# Patient Record
Sex: Female | Born: 1990 | Race: Black or African American | Hispanic: No | Marital: Single | Smoking: Current every day smoker
Health system: Southern US, Community
[De-identification: ages and names within clinical notes are randomized; demographics above are authoritative.]

## PROBLEM LIST (undated history)

## (undated) DIAGNOSIS — A599 Trichomoniasis, unspecified: Secondary | ICD-10-CM

## (undated) DIAGNOSIS — R87619 Unspecified abnormal cytological findings in specimens from cervix uteri: Secondary | ICD-10-CM

## (undated) DIAGNOSIS — J40 Bronchitis, not specified as acute or chronic: Secondary | ICD-10-CM

## (undated) DIAGNOSIS — A749 Chlamydial infection, unspecified: Secondary | ICD-10-CM

## (undated) DIAGNOSIS — B9689 Other specified bacterial agents as the cause of diseases classified elsewhere: Secondary | ICD-10-CM

## (undated) DIAGNOSIS — N760 Acute vaginitis: Secondary | ICD-10-CM

## (undated) DIAGNOSIS — A539 Syphilis, unspecified: Secondary | ICD-10-CM

## (undated) HISTORY — DX: Unspecified abnormal cytological findings in specimens from cervix uteri: R87.619

---
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
## 2012-10-29 IMAGING — CR DG CHEST 2V
2 series · 2 of 2 positions shown · non-contrast
Comparison: Chest x-ray 05/03/2010

CLINICAL DATA: Pain in the upper back.

CHEST - 2 VIEW

[w chest pa]
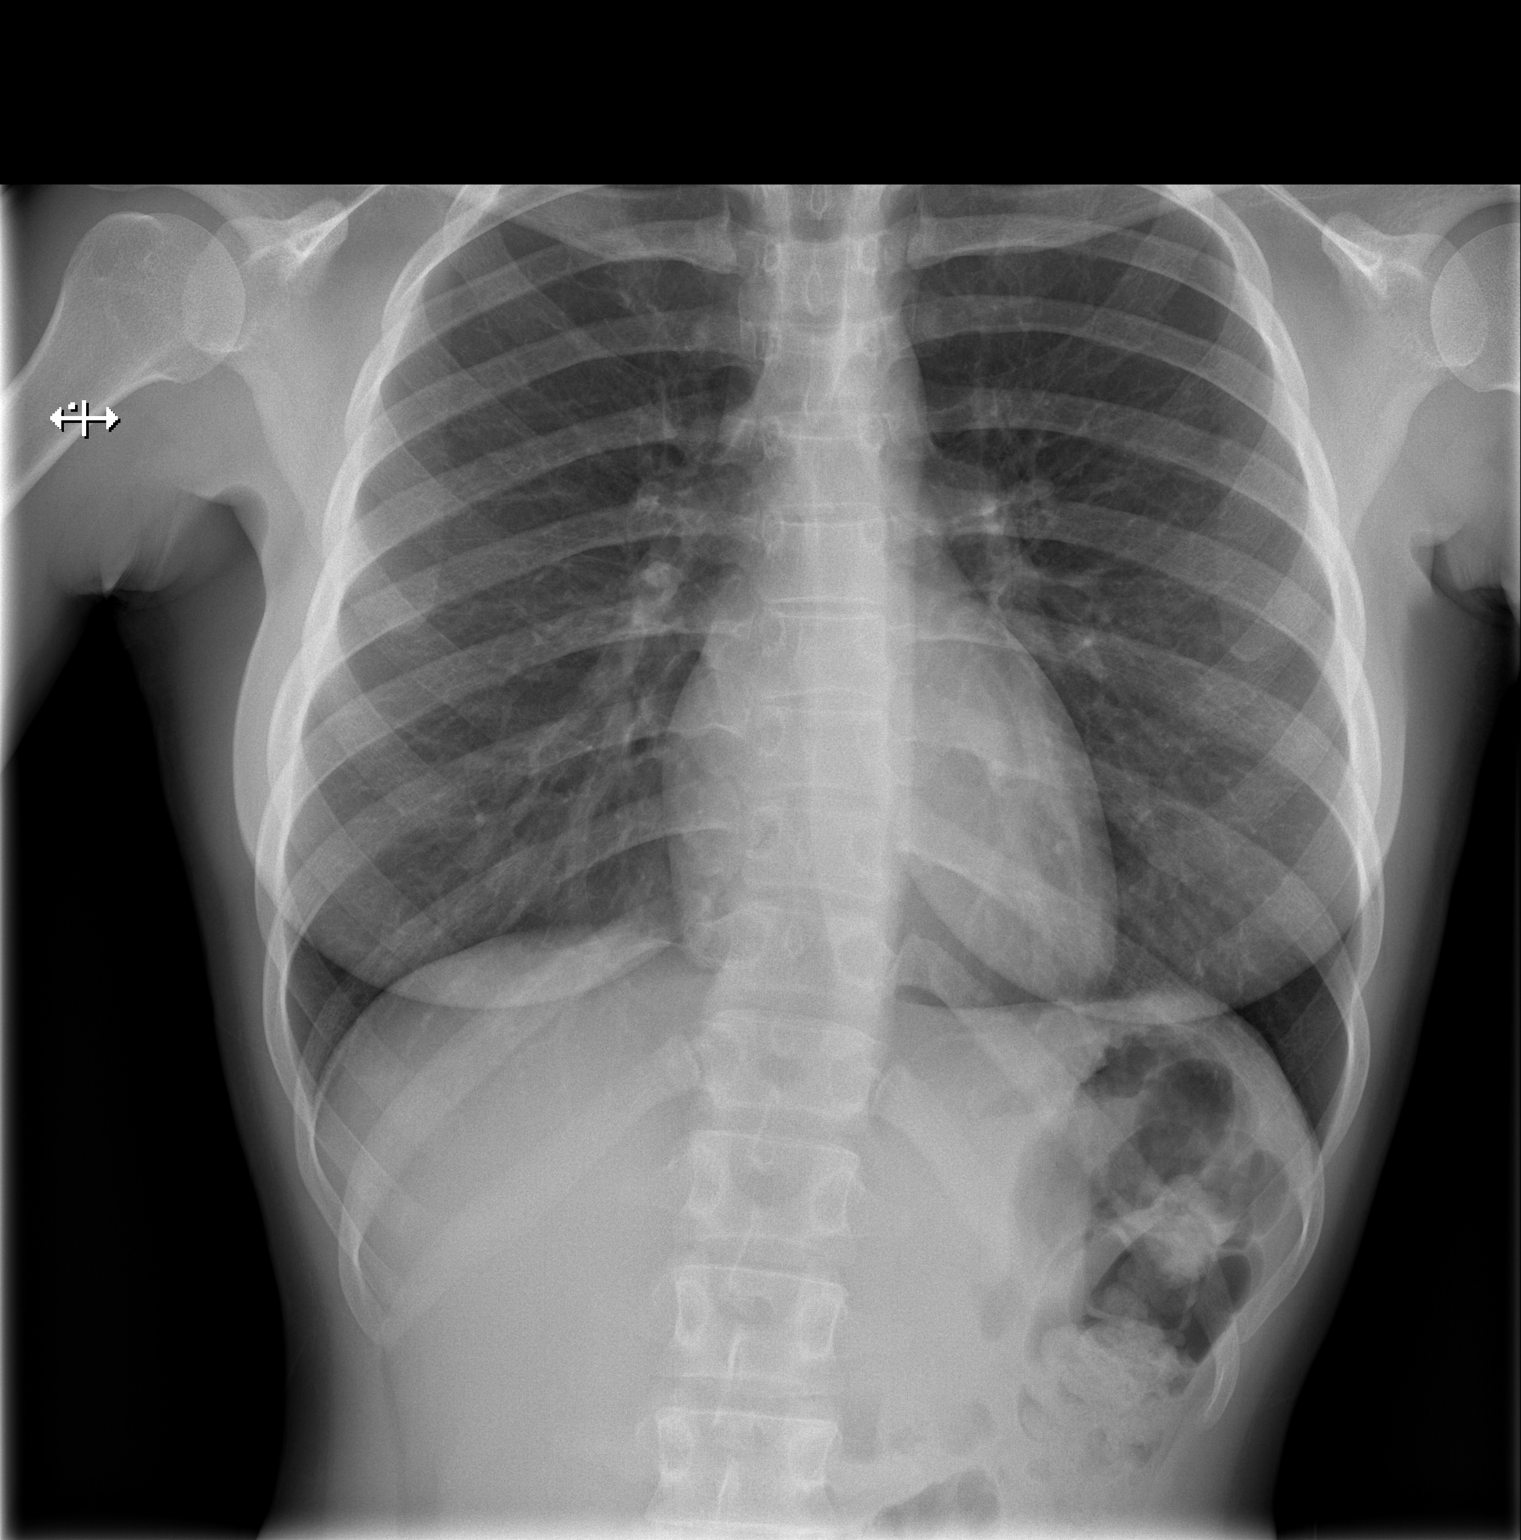

[w chest lat]
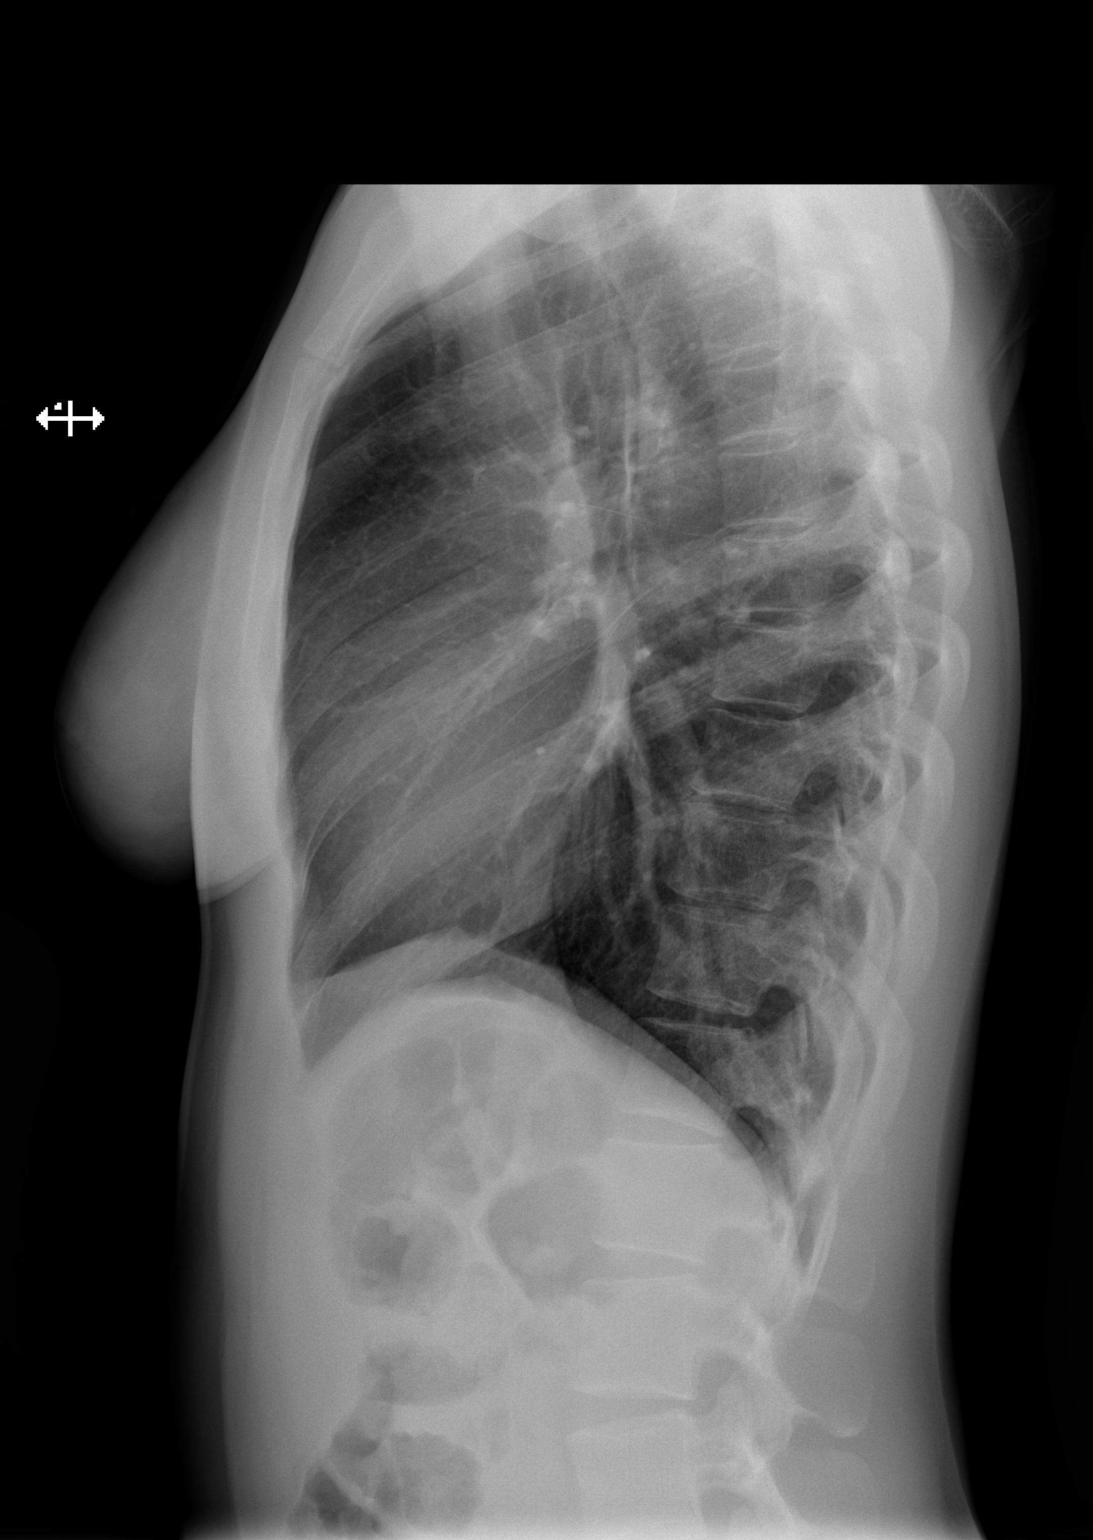

[2 of 2 positions shown; findings below may reference images not displayed]

FINDINGS: Lung volumes are normal.  No consolidative airspace
disease.  No pleural effusions.  No pneumothorax.  No pulmonary
nodule or mass noted.  Pulmonary vasculature and the
cardiomediastinal silhouette are within normal limits.
IMPRESSION: 1. No radiographic evidence of acute cardiopulmonary disease.

## 2012-11-17 ENCOUNTER — Emergency Department (HOSPITAL_COMMUNITY)
Admission: EM | Admit: 2012-11-17 | Discharge: 2012-11-17 | Disposition: A | Discharge status: Home / Self Care | Payer: Self-pay | Attending: Emergency Medicine | Admitting: Emergency Medicine

## 2012-11-17 ENCOUNTER — Encounter (HOSPITAL_COMMUNITY): Payer: Self-pay | Admitting: Emergency Medicine

## 2012-11-17 DIAGNOSIS — T50905A Adverse effect of unspecified drugs, medicaments and biological substances, initial encounter: Secondary | ICD-10-CM

## 2012-11-17 DIAGNOSIS — N898 Other specified noninflammatory disorders of vagina: Secondary | ICD-10-CM | POA: Insufficient documentation

## 2012-11-17 DIAGNOSIS — Z79899 Other long term (current) drug therapy: Secondary | ICD-10-CM | POA: Insufficient documentation

## 2012-11-17 DIAGNOSIS — Z8709 Personal history of other diseases of the respiratory system: Secondary | ICD-10-CM | POA: Insufficient documentation

## 2012-11-17 DIAGNOSIS — Z8619 Personal history of other infectious and parasitic diseases: Secondary | ICD-10-CM | POA: Insufficient documentation

## 2012-11-17 DIAGNOSIS — Y9389 Activity, other specified: Secondary | ICD-10-CM | POA: Insufficient documentation

## 2012-11-17 DIAGNOSIS — F17200 Nicotine dependence, unspecified, uncomplicated: Secondary | ICD-10-CM | POA: Insufficient documentation

## 2012-11-17 DIAGNOSIS — T38801A Poisoning by unspecified hormones and synthetic substitutes, accidental (unintentional), initial encounter: Secondary | ICD-10-CM | POA: Insufficient documentation

## 2012-11-17 DIAGNOSIS — T385X1A Poisoning by other estrogens and progestogens, accidental (unintentional), initial encounter: Secondary | ICD-10-CM | POA: Insufficient documentation

## 2012-11-17 DIAGNOSIS — Y929 Unspecified place or not applicable: Secondary | ICD-10-CM | POA: Insufficient documentation

## 2012-11-17 NOTE — ED Notes (Signed)
Pt added that she is also having sticky discharge this am.  Denies itching or abd pain.  States she recently tested negative for std.

## 2012-11-17 NOTE — ED Provider Notes (Signed)
History  This chart was scribed for non-physician practitioner working with Raeford Razor, MD by Greggory Stallion, ED scribe. This patient was seen in room WTR9/WTR9 and the patient's care was started at 3:34 PM.  CSN: [id number] Arrival date & time 11/17/12  1518   Chief Complaint  Patient presents with  . Rash   The history is provided by the patient. No language interpreter was used.    HPI Comments: Melanie Carr is a 22 y.o. female who presents to the Emergency Department with chief complaint of gradual onset, constant skin discoloration on her abdomen where she gets her depo shots that started one year ago. Pt states she got the new depo shot in summer 2013 and December 2013. Pt states there is a lump under the discoloration. She states she gets her dep[facility] shots from [facility]. Pt states she had sticky, clear vaginal discharge this morning. Pt states she got tested for STDs 2 weeks ago and it was negative for everything. She declines pelvic exam today.  Past Medical History  Diagnosis Date  . Bronchitis   . Trichomonosis   . Chlamydia   . Syphilis   . Bacterial vaginal infection    History reviewed. No pertinent past surgical history. History reviewed. No pertinent family history. History  Substance Use Topics  . Smoking status: Current Every Day Smoker  . Smokeless tobacco: Not on file  . Alcohol Use: No   OB History   Grav Para Term Preterm Abortions TAB SAB Ect Mult Living                 Review of Systems  A complete 10 system review of systems was obtained and all systems are negative except as noted in the HPI and PMH.   Allergies  Review of patient's allergies indicates no known allergies.  Home Medications   Current Outpatient Rx  Name  Route  Sig  Dispense  Refill  . medroxyPROGESTERone (DEPO-PROVERA) 150 MG/ML injection   Intramuscular   Inject 150 mg into the muscle every 3 (three) months. Due for another shot in march          BP 104/60  Pulse 88  Temp(Src) 99.1 F (37.3 C) (Oral)  Resp 16  SpO2 99%  Physical Exam  Nursing note and vitals reviewed. Constitutional: She is oriented to person, place, and time. She appears well-developed and well-nourished. No distress.  HENT:  Head: Normocephalic and atraumatic.  Eyes: EOM are normal.  Neck: Neck supple. No tracheal deviation present.  Cardiovascular: Normal rate.   Pulmonary/Chest: Effort normal. No respiratory distress.  Abdominal: Soft. She exhibits no distension and no mass. There is no tenderness. There is no rebound and no guarding.  Musculoskeletal: Normal range of motion.  Neurological: She is alert and oriented to person, place, and time.  Skin: Skin is warm and dry.     Two 1 cm areas of hypopigmentation on the abdomen near the naval as diagramed. No signs of infection, abscess, or cellulitis.   Psychiatric: She has a normal mood and affect. Her behavior is normal.    ED Course  Procedures (including critical care time)  DIAGNOSTIC STUDIES: Oxygen Saturation is 99% on RA, normal by my interpretation.    COORDINATION OF CARE: 4:10 PM-Discussed treatment plan with pt at bedside and pt agreed to plan.   Labs Reviewed - No data to display No results found. 1. Drug reaction, initial encounter     MDM  Patient with hypopigmentation following Depo-Provera injection. No evidence of surrounding cellulitis or infection. No emergent process. Will discharge to home with OB/GYN followup. Additionally, patient states that she has had some clear vaginal discharge today. However, she declines pelvic exam, and states that she will followup with OB/GYN. I told her that I would not do to her the source of the discharge without pelvic exam. She nausea is that she understands, but chooses to defer the pelvic today.        I personally performed the services described in this documentation, which was scribed in my presence. The recorded  information has been reviewed and is accurate.    Roxy Horseman, PA-C 11/17/12 [phone]

## 2012-11-17 NOTE — ED Notes (Addendum)
Pt states she was injected with depo-provera shot in abd in december and has since had skin lightening at site of injection. Pt states this has happened before. Pt went back 2 weeks ago and was recommended she get it checked.

## 2012-11-18 NOTE — ED Provider Notes (Signed)
Medical screening examination/treatment/procedure(s) were performed by non-physician practitioner and as supervising physician I was immediately available for consultation/collaboration.  Kaydyn Sayas, MD 11/18/12 0129 

## 2012-12-15 ENCOUNTER — Encounter: Payer: Medicaid Other | Admitting: Obstetrics and Gynecology

## 2013-08-03 ENCOUNTER — Telehealth: Payer: Self-pay | Admitting: Genetic Counselor

## 2013-08-03 ENCOUNTER — Other Ambulatory Visit (HOSPITAL_COMMUNITY)
Admission: RE | Admit: 2013-08-03 | Discharge: 2013-08-03 | Disposition: A | Discharge status: Home / Self Care | Payer: BC Managed Care – PPO | Source: Ambulatory Visit | Attending: Obstetrics and Gynecology | Admitting: Obstetrics and Gynecology

## 2013-08-03 ENCOUNTER — Other Ambulatory Visit: Payer: Self-pay | Admitting: Obstetrics and Gynecology

## 2013-08-03 DIAGNOSIS — Z01419 Encounter for gynecological examination (general) (routine) without abnormal findings: Secondary | ICD-10-CM | POA: Insufficient documentation

## 2013-08-03 NOTE — Telephone Encounter (Signed)
LEFT MESSAGE FOR PATIENT TO RETURN CALL TO SCHEDULE GENETIC APPT.  °

## 2014-02-10 ENCOUNTER — Emergency Department (HOSPITAL_COMMUNITY)
Admission: EM | Admit: 2014-02-10 | Discharge: 2014-02-10 | Disposition: A | Discharge status: Home / Self Care | Payer: BC Managed Care – PPO | Source: Home / Self Care | Attending: Family Medicine | Admitting: Family Medicine

## 2014-02-10 ENCOUNTER — Encounter (HOSPITAL_COMMUNITY): Payer: Self-pay | Admitting: Family Medicine

## 2014-02-10 DIAGNOSIS — B9789 Other viral agents as the cause of diseases classified elsewhere: Principal | ICD-10-CM

## 2014-02-10 DIAGNOSIS — J069 Acute upper respiratory infection, unspecified: Secondary | ICD-10-CM

## 2014-02-10 LAB — POCT RAPID STREP A: STREPTOCOCCUS, GROUP A SCREEN (DIRECT): NEGATIVE

## 2014-02-10 MED ORDER — IPRATROPIUM BROMIDE 0.06 % NA SOLN: 2.0000 | Freq: Four times a day (QID) | NASAL | Status: DC

## 2014-02-10 MED ORDER — IBUPROFEN 600 MG PO TABS: 600.0000 mg | ORAL_TABLET | Freq: Four times a day (QID) | ORAL | Status: DC | PRN

## 2014-02-10 MED ORDER — FLUTICASONE PROPIONATE 50 MCG/ACT NA SUSP: 2.0000 | Freq: Every day | NASAL | Status: DC

## 2014-02-10 NOTE — Discharge Instructions (Signed)
Your symptoms are likely due to a viral cold This should go away in another 1-5 days Please start the flonase at night adn atrovent during the day. Please also start the motrin 600 every 6 hours Please also consider using Mucinex or mucinex D during the day and Nyquill at night Please call if you are not better after a total of 7-10 days of illness or are getting worse.

## 2014-02-10 NOTE — ED Notes (Signed)
C/o cold sx onset yest am Sx inc congestion, runny nose, ST, facial pressure, vomiting this am Denies f/d, SOB, wheezing Alert, no signs of acute distress.

## 2014-02-10 NOTE — ED Provider Notes (Signed)
CSN: [id number]     Arrival date & time 02/10/14  0857 History   First MD Initiated Contact with Patient 02/10/14 [phone]     Chief Complaint  Patient presents with  . URI   (Consider location/radiation/quality/duration/timing/severity/associated sxs/prior Treatment) HPI  Sore throat: started 24 hrs ago. Associated w/ rinorrhea, sinus congestion. Post tussive emessis. nyquil w/ benefit. Sick contacts. Actively coughing in room.  No change. Deneis fevers or diarrhea.   Tobacco: cutting back and using gum to help   Past Medical History  Diagnosis Date  . Bronchitis   . Trichomonosis   . Chlamydia   . Syphilis   . Bacterial vaginal infection    History reviewed. No pertinent past surgical history. No family history on file. History  Substance Use Topics  . Smoking status: Current Every Day Smoker -- 0.25 packs/day  . Smokeless tobacco: Not on file  . Alcohol Use: No   OB History   Grav Para Term Preterm Abortions TAB SAB Ect Mult Living                 Review of Systems Per HPI with all other pertinent systems negative.   Allergies  Review of patient's allergies indicates no known allergies.  Home Medications   Prior to Admission medications   Medication Sig Start Date End Date Taking? Authorizing Provider  fluticasone (FLONASE) 50 MCG/ACT nasal spray Place 2 sprays into both nostrils daily. 02/10/14   Ozella Rocks, MD  ibuprofen (ADVIL,MOTRIN) 600 MG tablet Take 1 tablet (600 mg total) by mouth every 6 (six) hours as needed. 02/10/14   Ozella Rocks, MD  ipratropium (ATROVENT) 0.06 % nasal spray Place 2 sprays into both nostrils 4 (four) times daily. 02/10/14   Ozella Rocks, MD  medroxyPROGESTERone (DEPO-PROVERA) 150 MG/ML injection Inject 150 mg into the muscle every 3 (three) months. Due for another shot in march    Historical Provider, MD   BP 105/72  Pulse 87  Temp(Src) 98 F (36.7 C) (Oral)  Resp 16  SpO2 100%  LMP 02/03/2014 Physical Exam   Constitutional: She is oriented to person, place, and time. She appears well-developed and well-nourished. No distress.  HENT:  Head: Normocephalic and atraumatic.  Pharyngeal cobblestoning. Tonsils nml Maxillary sinuses mildly ttp L>R  Eyes: Pupils are equal, round, and reactive to light.  Neck: Normal range of motion.  Cardiovascular: Normal rate and normal heart sounds.   Pulmonary/Chest: Effort normal and breath sounds normal.  Abdominal: She exhibits no distension.  Musculoskeletal: Normal range of motion.  Neurological: She is oriented to person, place, and time.  Skin: Skin is warm and dry. She is not diaphoretic.  Psychiatric: Her behavior is normal. Judgment normal.    ED Course  Procedures (including critical care time) Labs Review Labs Reviewed  POCT RAPID STREP A ([facility] ONLY)    Imaging Review No results found.   MDM   1. Viral URI with cough    Flonase, nasal atrovent, motrin 600, mucinex, nyquil, fluids, rest Rapid Strp neg Stre cx sent ABX if not improving in 7-10 days. Precautions given and all questions answered  Shelly Flatten, MD Family Medicine 02/10/2014, 9:44 AM     Ozella Rocks, MD 02/10/14 [phone]

## 2014-02-12 LAB — CULTURE, GROUP A STREP

## 2014-10-20 ENCOUNTER — Encounter (HOSPITAL_COMMUNITY): Payer: Self-pay | Admitting: Emergency Medicine

## 2014-10-20 ENCOUNTER — Emergency Department (INDEPENDENT_AMBULATORY_CARE_PROVIDER_SITE_OTHER)
Admission: EM | Admit: 2014-10-20 | Discharge: 2014-10-20 | Disposition: A | Discharge status: Home / Self Care | Payer: Self-pay | Source: Home / Self Care | Attending: Emergency Medicine | Admitting: Emergency Medicine

## 2014-10-20 ENCOUNTER — Other Ambulatory Visit (HOSPITAL_COMMUNITY)
Admission: RE | Admit: 2014-10-20 | Discharge: 2014-10-20 | Disposition: A | Discharge status: Home / Self Care | Payer: Medicaid Other | Source: Ambulatory Visit | Attending: Emergency Medicine | Admitting: Emergency Medicine

## 2014-10-20 DIAGNOSIS — N939 Abnormal uterine and vaginal bleeding, unspecified: Secondary | ICD-10-CM

## 2014-10-20 DIAGNOSIS — Z113 Encounter for screening for infections with a predominantly sexual mode of transmission: Secondary | ICD-10-CM | POA: Insufficient documentation

## 2014-10-20 DIAGNOSIS — N760 Acute vaginitis: Secondary | ICD-10-CM | POA: Insufficient documentation

## 2014-10-20 LAB — POCT PREGNANCY, URINE: Preg Test, Ur: NEGATIVE

## 2014-10-20 MED ORDER — MEDROXYPROGESTERONE ACETATE 10 MG PO TABS: 10.0000 mg | ORAL_TABLET | Freq: Every day | ORAL | Status: DC

## 2014-10-20 NOTE — ED Notes (Signed)
C/o abn vaginal bleeding States her cycle came on the last week of April and lasted until second week of may and restarted may 21 No tx tried

## 2014-10-20 NOTE — Discharge Instructions (Signed)
This is likely a stress reaction. Take Provera daily for the next 2 weeks. You should expect bleeding when you stop the medication. If her cycles continue to be irregular, please follow[facility]up at [facility].

## 2014-10-20 NOTE — ED Provider Notes (Signed)
CSN: [id number]     Arrival date & time 10/20/14  1255 History   First MD Initiated Contact with Patient 10/20/14 1339     Chief Complaint  Patient presents with  . Vaginal Bleeding   (Consider location/radiation/quality/duration/timing/severity/associated sxs/prior Treatment) HPI  She is a 24 year old woman here for evaluation of vaginal bleeding. She states she had her normal period the end of April.  Then on May 21, she started having bleeding again. She has had constant bleeding since that time. It will sometimes be light, sometimes heavy. She denies any associated cramps. No vaginal discharge. No fevers. She has had a new sexual partner, but states it was only protected sex. She did have multiple life stressors back in February. She is not currently on any birth control. She stopped Ortho Tri-Cyclen 6 months ago.  Past Medical History  Diagnosis Date  . Bronchitis   . Trichomonosis   . Chlamydia   . Syphilis   . Bacterial vaginal infection    History reviewed. No pertinent past surgical history. History reviewed. No pertinent family history. History  Substance Use Topics  . Smoking status: Current Every Day Smoker -- 0.25 packs/day  . Smokeless tobacco: Not on file  . Alcohol Use: No   OB History    No data available     Review of Systems As in history of present illness Allergies  Review of patient's allergies indicates no known allergies.  Home Medications   Prior to Admission medications   Medication Sig Start Date End Date Taking? Authorizing Provider  medroxyPROGESTERone (PROVERA) 10 MG tablet Take 1 tablet (10 mg total) by mouth daily. For 2 weeks. 10/20/14   Charm RingsErin J Talissa Apple, MD   BP 114/76 mmHg  Pulse 66  Temp(Src) 98.2 F (36.8 C) (Oral)  Resp 12  SpO2 100%  LMP 10/09/2014 Physical Exam  Constitutional: She is oriented to person, place, and time. She appears well-developed and well-nourished. No distress.  Cardiovascular: Normal rate.   Pulmonary/Chest:  Effort normal.  Genitourinary: There is no rash on the right labia. There is no rash on the left labia. Cervix exhibits no motion tenderness and no discharge. There is bleeding in the vagina. No vaginal discharge found.  Neurological: She is alert and oriented to person, place, and time.    ED Course  Procedures (including critical care time) Labs Review Labs Reviewed  POCT PREGNANCY, URINE  CERVICOVAGINAL ANCILLARY ONLY    Imaging Review No results found.   MDM   1. Vaginal bleeding, abnormal    This is likely stress related. We'll treat with 14 days of Provera. Follow-up as needed.    Charm RingsErin J Sima Lindenberger, MD 10/20/14 [phone]

## 2014-10-21 LAB — CERVICOVAGINAL ANCILLARY ONLY
Chlamydia: NEGATIVE
Neisseria Gonorrhea: NEGATIVE

## 2014-10-22 LAB — CERVICOVAGINAL ANCILLARY ONLY: Wet Prep (BD Affirm): POSITIVE — AB

## 2014-10-26 NOTE — ED Notes (Signed)
No treatment per Dr Honig 

## 2016-12-14 ENCOUNTER — Encounter: Payer: Self-pay | Admitting: Physician Assistant

## 2016-12-14 ENCOUNTER — Ambulatory Visit (INDEPENDENT_AMBULATORY_CARE_PROVIDER_SITE_OTHER): Payer: 59 | Admitting: Physician Assistant

## 2016-12-14 VITALS — BP 105/66 | HR 86 | Temp 98.1°F | Resp 18 | Ht 65.0 in | Wt 128.6 lb

## 2016-12-14 DIAGNOSIS — Z113 Encounter for screening for infections with a predominantly sexual mode of transmission: Secondary | ICD-10-CM

## 2016-12-14 DIAGNOSIS — Z111 Encounter for screening for respiratory tuberculosis: Secondary | ICD-10-CM | POA: Diagnosis not present

## 2016-12-14 DIAGNOSIS — Z124 Encounter for screening for malignant neoplasm of cervix: Secondary | ICD-10-CM

## 2016-12-14 DIAGNOSIS — Z130 Encounter for screening for diseases of the blood and blood-forming organs and certain disorders involving the immune mechanism: Secondary | ICD-10-CM | POA: Diagnosis not present

## 2016-12-14 DIAGNOSIS — Z23 Encounter for immunization: Secondary | ICD-10-CM

## 2016-12-14 DIAGNOSIS — Z7689 Persons encountering health services in other specified circumstances: Secondary | ICD-10-CM

## 2016-12-14 DIAGNOSIS — Z1329 Encounter for screening for other suspected endocrine disorder: Secondary | ICD-10-CM

## 2016-12-14 DIAGNOSIS — Z1389 Encounter for screening for other disorder: Secondary | ICD-10-CM

## 2016-12-14 DIAGNOSIS — Z13220 Encounter for screening for lipoid disorders: Secondary | ICD-10-CM

## 2016-12-14 DIAGNOSIS — Z0000 Encounter for general adult medical examination without abnormal findings: Secondary | ICD-10-CM

## 2016-12-14 DIAGNOSIS — Z13228 Encounter for screening for other metabolic disorders: Secondary | ICD-10-CM | POA: Diagnosis not present

## 2016-12-14 DIAGNOSIS — F17200 Nicotine dependence, unspecified, uncomplicated: Secondary | ICD-10-CM

## 2016-12-14 DIAGNOSIS — R7989 Other specified abnormal findings of blood chemistry: Secondary | ICD-10-CM

## 2016-12-14 DIAGNOSIS — R87612 Low grade squamous intraepithelial lesion on cytologic smear of cervix (LGSIL): Secondary | ICD-10-CM

## 2016-12-14 DIAGNOSIS — R946 Abnormal results of thyroid function studies: Secondary | ICD-10-CM

## 2016-12-14 LAB — POCT URINALYSIS DIP (MANUAL ENTRY)
Bilirubin, UA: NEGATIVE
GLUCOSE UA: NEGATIVE mg/dL
Ketones, POC UA: NEGATIVE mg/dL
Leukocytes, UA: NEGATIVE
Nitrite, UA: NEGATIVE
PH UA: 7.5 (ref 5.0–8.0)
Protein Ur, POC: NEGATIVE mg/dL
SPEC GRAV UA: 1.01 (ref 1.010–1.025)
Urobilinogen, UA: 0.2 E.U./dL

## 2016-12-14 NOTE — Progress Notes (Signed)
Chief Complaint  Patient presents with  . Annual Exam    personal use and for school [employer], PT requesting PAP.   HPI Melanie Carr is a 26 yo female with no significant past medical history present to the clinic today wanting an annual physical exam. Melanie Carr is starting school at [employer] this fall and would like to have her physical documents signed so she can start school. Melanie Carr would like to establish a PCP, for her prior health related needs she just went [facility]. She has brought with her today a list of her immunizations. She is interested in stopping smoking and would like information on helping her quit, for she has already decreased from 1 pack to 1/4 pack of cigarettes per day. Melanie Carr discontinued Provera as a form of contraception due to its side effects, and is not interested in other forms of birth control at this time.   Had discussion with patient about Gardisil and Meningococcal vaccines, and patient denied them at this time, but is interested in the future.    No Known Allergies  There are no active problems to display for this patient.  Prior to Admission medications   Not on File   Review of Systems  Constitutional: Positive for malaise/fatigue. Negative for chills, fever and weight loss.  HENT: Negative for congestion, ear discharge, ear pain, hearing loss, nosebleeds, sinus pain and tinnitus.   Eyes: Positive for pain (Only associates with straining her eyes when she needs to put on her glasses). Negative for blurred vision and double vision.  Respiratory: Negative.  Negative for cough, shortness of breath and wheezing.   Cardiovascular: Negative for chest pain and palpitations.  Gastrointestinal: Positive for heartburn (Had a case of heartburn last week. Took liquid medicine for it and stopped the next day). Negative for abdominal pain, blood in stool, constipation, diarrhea, melena, nausea  and vomiting.  Genitourinary: Negative for dysuria and urgency.       Admits to dysmenorrhea. Admits to menorrhagia since discontinuing her Provera months ago.   Musculoskeletal: Negative for back pain and neck pain.  Skin: Negative for itching and rash.  Neurological: Positive for weakness and headaches (Sparingly, they happen occipial or temporal region of the head. Got reading glasses for. Takes Ibuprofen or APAP and helps). Negative for seizures and loss of consciousness.  Psychiatric/Behavioral: Negative for depression and suicidal ideas.   Vital signs BP 105/66 (BP Location: Right Arm, Patient Position: Sitting, Cuff Size: Small)   Pulse 86   Temp 98.1 F (36.7 C) (Oral)   Resp 18   Ht 5\' 5"  (1.651 m)   Wt 128 lb 9.6 oz (58.3 kg)   LMP 12/10/2016   SpO2 98%   BMI 21.40 kg/m    Physical Exam  Constitutional: She is well-developed, well-nourished, and in no distress.  HENT:  Right Ear: External ear normal.  Left Ear: External ear normal.  Nose: Nose normal. Right sinus exhibits no maxillary sinus tenderness and no frontal sinus tenderness. Left sinus exhibits no maxillary sinus tenderness and no frontal sinus tenderness.  Mouth/Throat: Uvula is midline and oropharynx is clear and moist. No oropharyngeal exudate.  Cardiovascular: Normal rate, regular rhythm and normal heart sounds.   Pulses:      Radial pulses are 2+ on the right side, and 2+ on the left side.  Pulmonary/Chest: Effort normal.  Abdominal: Soft. Bowel sounds are normal. There is no tenderness.  Genitourinary: Vagina normal,  uterus normal, cervix normal, right adnexa normal, left adnexa normal and vulva normal.  Lymphadenopathy:    She has no cervical adenopathy.       Right cervical: No superficial cervical, no deep cervical and no posterior cervical adenopathy present.      Left cervical: No superficial cervical, no deep cervical and no posterior cervical adenopathy present.  Neurological: She is alert.   Reflex Scores:      Brachioradialis reflexes are 2+ on the right side and 2+ on the left side.      Patellar reflexes are 2+ on the right side and 2+ on the left side. Skin: Skin is warm.   Visit Diagnoses 1. Encounter to establish care 2. Annual physical exam 3. Routine screening for STI (sexually transmitted infection) Informed patient that she will be notified of results. - HIV antibody - RPR - Hepatitis B surface antibody - Hepatitis B surface antigen - Hepatitis C antibody - Pap IG, CT/NG w/ reflex HPV when ASC-U  4. Need for Tdap vaccination For school physical this is a required vaccine. - Tdap vaccine greater than or equal to 7yo IM  5. Need for HPV vaccination Discussed obtaining this vaccine before she is 28.  6. Screening for cervical cancer Informed patient that she will be notified of results. - Pap IG, CT/NG w/ reflex HPV when ASC-U  7. Screening for deficiency anemia - CBC with Differential/Platelet  8. Screening for metabolic disorder - Comprehensive metabolic panel  9. Screening for hyperlipidemia - Lipid panel  10. Screening for thyroid disorder - TSH  11. Screening for blood or protein in urine - POCT urinalysis dipstick  12. Screening-pulmonary TB Obtained a PPD today. Will get it read on 12/17/16. - TB Skin Test

## 2016-12-14 NOTE — Patient Instructions (Addendum)
IF you received an x-ray today, you will receive an invoice from [facility]. Please contact [facility] at [phone] with questions or concerns regarding your invoice.   IF you received labwork today, you will receive an invoice from [facility]. Please contact [facility] at [phone] with questions or concerns regarding your invoice.   Our billing staff will not be able to assist you with questions regarding bills from these companies.  You will be contacted with the lab results as soon as they are available. The fastest way to get your results is to activate your My Chart account. Instructions are located on the last page of this paperwork. If you have not heard from us regarding the results in 2 weeks, please contact this office.    Did you know that you begin to benefit from quitting smoking within the first twenty minutes? It's TRUE.  At 20 minutes: -blood pressure decreases -pulse rate drops -body temperature of hands and feet increases  At 8 hours: -carbon monoxide level in blood drops to normal -oxygen level in blood increases to normal  At 24 hours: -the chance of heart attack decreases  At 48 hours: -nerve endings start regrowing -ability to smell and taste is enhanced  2 weeks-3 months: -circulation improves -walking becomes easier -lung function improves  1-9 months: -coughing, sinus congestion, fatigue and shortness of breath decreases  1 year: -excess risk of heart disease is decreased to HALF that of a smoker  5 years: Stroke risk is reduced to that of people who have never smoked  10 years: -risk of lung cancer drops to as little as half that of continuing smokers -risk of cancer of the mouth, throat, esophagus, bladder, kidney and pancreas decreases -risk of ulcer decreases  15 years -risk of heart disease is now similar to that of people who have never smoked -risk of death returns to nearly the level of people who have  never smoked   Keeping You Healthy  Get These Tests 1. Blood Pressure- Have your blood pressure checked once a year by your health care provider.  Normal blood pressure is 120/80. 2. Weight- Have your body mass index (BMI) calculated to screen for obesity.  BMI is measure of body fat based on height and weight.  You can also calculate your own BMI at https://www.west-esparza.com/www.nhlbisupport.com/bmi/. 3. Cholesterol- Have your cholesterol checked every 5 years starting at age 24 then yearly starting at age 24. 4. Chlamydia, HIV, and other sexually transmitted diseases- Get screened every year until age 24, then within three months of each new sexual provider. 5. Pap Test - Every 1-5 years; discuss with your health care provider. 6. Mammogram- Every 1-2 years starting at age 24--50  Take these medicines  Calcium with Vitamin D-Your body needs 1200 mg of Calcium each day and [phone] IU of Vitamin D daily.  Your body can only absorb 500 mg of Calcium at a time so Calcium must be taken in 2 or 3 divided doses throughout the day.  Multivitamin with folic acid- Once daily if it is possible for you to become pregnant.  Get these Immunizations  Gardasil-Series of three doses; prevents HPV related illness such as genital warts and cervical cancer.  Menactra-Single dose; prevents meningitis.  Tetanus shot- Every 10 years.  Flu shot-Every year.  Take these steps 1. Do not smoke-Your healthcare provider can help you quit.  For tips on how to quit go to www.smokefree.gov or call 1-800 QUITNOW. 2. Be physically active-  Exercise 5 days a week for at least 30 minutes.  If you are not already physically active, start slow and gradually work up to 30 minutes of moderate physical activity.  Examples of moderate activity include walking briskly, dancing, swimming, bicycling, etc. 3. Breast Cancer- A self breast exam every month is important for early detection of breast cancer.  For more information and instruction on self  breast exams, ask your healthcare provider or [place].eswww.womenshealth.gov/faq/breast-self-exam.cfm. 4. Eat a healthy diet- Eat a variety of healthy foods such as fruits, vegetables, whole grains, low fat milk, low fat cheeses, yogurt, lean meats, poultry and fish, beans, nuts, tofu, etc.  For more information go to www. Thenutritionsource.org 5. Drink alcohol in moderation- Limit alcohol intake to one drink or less per day. Never drink and drive. 6. Depression- Your emotional health is as important as your physical health.  If you're feeling down or losing interest in things you normally enjoy please talk to your healthcare provider about being screened for depression. 7. Dental visit- Brush and floss your teeth twice daily; visit your dentist twice a year. 8. Eye doctor- Get an eye exam at least every 2 years. 9. Helmet use- Always wear a helmet when riding a bicycle, motorcycle, rollerblading or skateboarding. 10. Safe sex- If you may be exposed to sexually transmitted infections, use a condom. 11. Seat belts- Seat belts can save your live; always wear one. 12. Smoke/Carbon Monoxide detectors- These detectors need to be installed on the appropriate level of your home. Replace batteries at least once a year. 13. Skin cancer- When out in the sun please cover up and use sunscreen 15 SPF or higher. 14. Violence- If anyone is threatening or hurting you, please tell your healthcare provider.

## 2016-12-14 NOTE — Progress Notes (Signed)
Patient ID: Melanie Carr, female    DOB: 07-04-1990, 26 y.o.   MRN: [id number]  PCP: Default, Provider, MD  Chief Complaint  Patient presents with  . Annual Exam    personal use and for school [employer], PT requesting PAP.    Subjective:   Presents for [employer] Wellness Visit and to establish care.  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  She is interested in discussing options for smoking cessation, and has cut back from 1 ppd to 1/4 ppd. She has been smoking for approximately 6 years.  She is not currently interested in prescription contraception. Previously used progestin-only product, which she stopped due to adverse effects.    Patient Active Problem List   Diagnosis Date Noted  . Smoker 12/16/2016    Past Medical History:  Diagnosis Date  . Bacterial vaginal infection   . Bronchitis   . Chlamydia   . Syphilis   . Trichomonosis      Prior to Admission medications   Not on File    No Known Allergies  History reviewed. No pertinent surgical history.  Family History  Problem Relation Age of Onset  . Cancer Father [id number]       Colon Cancer  . Hypertension Brother   . Hypertension Maternal Grandmother   . Diabetes Maternal Grandfather   . Hypertension Paternal Grandmother   . Hypertension Paternal Grandfather     Social History   Social History  . Marital status: Single    Spouse name: N/A  . Number of children: N/A  . Years of education: Associates degree   Occupational History  . Student     [employer]   Social History Main Topics  . Smoking status: Current Every Day Smoker    Packs/day: 0.25    Years: 0.50    Types: Cigarettes  . Smokeless tobacco: Never Used     Comment: Would like to have help quitting smoking. Smoked 1 pack a day for 2.5 years previously  . Alcohol use 0.0 - 0.6 oz/week  . Drug use: No  . Sexual activity: Yes    Birth  control/ protection: Condom     Comment: Not on birth control   Other Topics Concern  . None   Social History Narrative   [place] herself.        Review of Systems Constitutional: Positive for malaise/fatigue. Negative for chills, fever and weight loss.  HENT: Negative for congestion, ear discharge, ear pain, hearing loss, nosebleeds, sinus pain and tinnitus.   Eyes: Positive for pain (Only associates with straining her eyes when she needs to put on her glasses). Negative for blurred vision and double vision.  Respiratory: Negative.  Negative for cough, shortness of breath and wheezing.   Cardiovascular: Negative for chest pain and palpitations.  Gastrointestinal: Positive for heartburn (Had a case of heartburn last week. Took liquid medicine for it and stopped the next day). Negative for abdominal pain, blood in stool, constipation, diarrhea, melena, nausea and vomiting.  Genitourinary: Negative for dysuria and urgency.       Admits to dysmenorrhea. Admits to menorrhagia since discontinuing her Provera months ago.   Musculoskeletal: Negative for back pain and neck pain.  Skin: Negative for itching and rash.  Neurological: Positive for weakness and headaches (Sparingly, occipital or temporal. Got reading glasses for this. Ibuprofen or APAP with relief). Negative for seizures and loss of consciousness.  Psychiatric/Behavioral: Negative for depression and suicidal ideas.      Objective:  Physical Exam  Constitutional: She is oriented to person, place, and time. Vital signs are normal. She appears well-developed and well-nourished. She is active and cooperative. No distress.  BP 105/66 (BP Location: Right Arm, Patient Position: Sitting, Cuff Size: Small)   Pulse 86   Temp 98.1 F (36.7 C) (Oral)   Resp 18   Ht 5\' 5"  (1.651 m)   Wt 128 lb 9.6 oz (58.3 kg)   LMP 12/10/2016   SpO2 98%   BMI 21.40 kg/m    HENT:  Head: Normocephalic and atraumatic.  Right Ear:  Hearing, tympanic membrane, external ear and ear canal normal. No foreign bodies.  Left Ear: Hearing, tympanic membrane, external ear and ear canal normal. No foreign bodies.  Nose: Nose normal.  Mouth/Throat: Uvula is midline, oropharynx is clear and moist and mucous membranes are normal. No oral lesions. Normal dentition. No dental abscesses or uvula swelling. No oropharyngeal exudate.  Eyes: Pupils are equal, round, and reactive to light. Conjunctivae, EOM and lids are normal. Right eye exhibits no discharge. Left eye exhibits no discharge. No scleral icterus.  Fundoscopic exam:      The right eye shows no arteriolar narrowing, no AV nicking, no exudate, no hemorrhage and no papilledema. The right eye shows red reflex.       The left eye shows no arteriolar narrowing, no AV nicking, no exudate, no hemorrhage and no papilledema. The left eye shows red reflex.  Neck: Trachea normal, normal range of motion and full passive range of motion without pain. Neck supple. No spinous process tenderness and no muscular tenderness present. No thyroid mass and no thyromegaly present.  Cardiovascular: Normal rate, regular rhythm, normal heart sounds, intact distal pulses and normal pulses.   Pulmonary/Chest: Effort normal and breath sounds normal. She exhibits no tenderness and no retraction. Right breast exhibits no inverted nipple, no mass, no nipple discharge, no skin change and no tenderness. Left breast exhibits no inverted nipple, no mass, no nipple discharge, no skin change and no tenderness. Breasts are symmetrical.  Abdominal: Soft. Normal appearance and bowel sounds are normal. She exhibits no distension and no mass. There is no hepatosplenomegaly. There is no tenderness. There is no rigidity, no rebound, no guarding, no CVA tenderness, no tenderness at McBurney's point and negative Murphy's sign. No hernia. Hernia confirmed negative in the right inguinal area and confirmed negative in the left inguinal  area.  Genitourinary: Rectum normal, vagina normal and uterus normal. Rectal exam shows no external hemorrhoid and no fissure. No breast swelling, tenderness, discharge or bleeding. Pelvic exam was performed with patient supine. No labial fusion. There is no rash, tenderness, lesion or injury on the right labia. There is no rash, tenderness, lesion or injury on the left labia. Cervix exhibits no motion tenderness, no discharge and no friability. Right adnexum displays no mass, no tenderness and no fullness. Left adnexum displays no mass, no tenderness and no fullness. No erythema, tenderness or bleeding in the vagina. No foreign body in the vagina. No signs of injury around the vagina. No vaginal discharge found.  Musculoskeletal: She exhibits no edema or tenderness.       Cervical back: Normal.       Thoracic back: Normal.       Lumbar back: Normal.  Lymphadenopathy:       Head (right side): No tonsillar, no preauricular, no posterior auricular and no occipital adenopathy present.  Head (left side): No tonsillar, no preauricular, no posterior auricular and no occipital adenopathy present.    She has no cervical adenopathy.    She has no axillary adenopathy.       Right: No inguinal and no supraclavicular adenopathy present.       Left: No inguinal and no supraclavicular adenopathy present.  Neurological: She is alert and oriented to person, place, and time. She has normal strength and normal reflexes. No cranial nerve deficit. She exhibits normal muscle tone. Coordination and gait normal.  Skin: Skin is warm, dry and intact. No rash noted. She is not diaphoretic. No cyanosis or erythema. Nails show no clubbing.  Psychiatric: She has a normal mood and affect. Her speech is normal and behavior is normal. Judgment and thought content normal.       Assessment & Plan:   Problem List Items Addressed This Visit    Smoker    Not interested in prescription smoking cessation at this time. OTC  nicotine replacement products are also an option. Encouragement provided.       Other Visit Diagnoses    Encounter to establish care    -  Primary   Annual physical exam       Age appropriate health guidance provided.   Routine screening for STI (sexually transmitted infection)       Relevant Orders   HIV antibody (Completed)   RPR (Completed)   Hepatitis B surface antibody (Completed)   Hepatitis B surface antigen (Completed)   Hepatitis C antibody (Completed)   Pap IG, CT/NG w/ reflex HPV when ASC-U   Need for Tdap vaccination       Relevant Orders   Tdap vaccine greater than or equal to 7yo IM (Completed)   Need for HPV vaccination       declines today. Encouraged to obtain 3-dose series before age 24 years.   Screening for cervical cancer       if cytology is normal, repeat in 3 years.   Relevant Orders   Pap IG, CT/NG w/ reflex HPV when ASC-U   Screening for deficiency anemia       Relevant Orders   CBC with Differential/Platelet (Completed)   Screening for metabolic disorder       Relevant Orders   Comprehensive metabolic panel (Completed)   Screening for hyperlipidemia       Relevant Orders   Lipid panel (Completed)   Screening for thyroid disorder       Relevant Orders   TSH (Completed)   Screening for blood or protein in urine       Relevant Orders   POCT urinalysis dipstick (Completed)   Screening-pulmonary TB       PPD placed. return for reading in 48-72 hours.   Relevant Orders   TB Skin Test (Completed)       Return in about 1 year (around 12/14/2017) for Wellness visit, sooner for meningitis and HPV vaccines if desired; RETURN MONDAY 7/30 for PPD read.   Fernande Brashelle S. Omran Keelin, PA-C Primary Care at [facility]

## 2016-12-15 LAB — COMPREHENSIVE METABOLIC PANEL
ALT: 10 IU/L (ref 0–32)
AST: 20 IU/L (ref 0–40)
Albumin/Globulin Ratio: 1.6 (ref 1.2–2.2)
Albumin: 4.7 g/dL (ref 3.5–5.5)
Alkaline Phosphatase: 50 IU/L (ref 39–117)
BILIRUBIN TOTAL: 0.4 mg/dL (ref 0.0–1.2)
BUN/Creatinine Ratio: 10 (ref 9–23)
BUN: 7 mg/dL (ref 6–20)
CO2: 23 mmol/L (ref 20–29)
CREATININE: 0.71 mg/dL (ref 0.57–1.00)
Calcium: 9.5 mg/dL (ref 8.7–10.2)
Chloride: 104 mmol/L (ref 96–106)
GFR calc Af Amer: 138 mL/min/{1.73_m2} (ref >59)
GFR calc non Af Amer: 120 mL/min/{1.73_m2} (ref >59)
GLUCOSE: 67 mg/dL (ref 65–99)
Globulin, Total: 2.9 g/dL (ref 1.5–4.5)
Potassium: 4.1 mmol/L (ref 3.5–5.2)
SODIUM: 141 mmol/L (ref 134–144)
TOTAL PROTEIN: 7.6 g/dL (ref 6.0–8.5)

## 2016-12-15 LAB — HIV ANTIBODY (ROUTINE TESTING W REFLEX): HIV Screen 4th Generation wRfx: NONREACTIVE

## 2016-12-15 LAB — CBC WITH DIFFERENTIAL/PLATELET
BASOS ABS: 0 10*3/uL (ref 0.0–0.2)
BASOS: 0 %
EOS (ABSOLUTE): 0.3 10*3/uL (ref 0.0–0.4)
EOS: 3 %
HEMOGLOBIN: 12.6 g/dL (ref 11.1–15.9)
Hematocrit: 37.4 % (ref 34.0–46.6)
Immature Grans (Abs): 0 10*3/uL (ref 0.0–0.1)
Immature Granulocytes: 0 %
LYMPHS ABS: 2.4 10*3/uL (ref 0.7–3.1)
LYMPHS: 27 %
MCH: 30.7 pg (ref 26.6–33.0)
MCHC: 33.7 g/dL (ref 31.5–35.7)
MCV: 91 fL (ref 79–97)
MONOCYTES: 4 %
Monocytes Absolute: 0.4 10*3/uL (ref 0.1–0.9)
Neutrophils Absolute: 6 10*3/uL (ref 1.4–7.0)
Neutrophils: 66 %
Platelets: 218 10*3/uL (ref 150–379)
RBC: 4.1 x10E6/uL (ref 3.77–5.28)
RDW: 13.5 % (ref 12.3–15.4)
WBC: 9.1 10*3/uL (ref 3.4–10.8)

## 2016-12-15 LAB — HEPATITIS B SURFACE ANTIBODY, QUANTITATIVE: Hepatitis B Surf Ab Quant: <3.1 m[IU]/mL — ABNORMAL LOW (ref >9.9)

## 2016-12-15 LAB — LIPID PANEL
CHOL/HDL RATIO: 2.8 ratio (ref 0.0–4.4)
Cholesterol, Total: 120 mg/dL (ref 100–199)
HDL: 43 mg/dL (ref >39)
LDL Calculated: 58 mg/dL (ref 0–99)
TRIGLYCERIDES: 97 mg/dL (ref 0–149)
VLDL Cholesterol Cal: 19 mg/dL (ref 5–40)

## 2016-12-15 LAB — RPR: RPR Ser Ql: NONREACTIVE

## 2016-12-15 LAB — HEPATITIS B SURFACE ANTIGEN: Hepatitis B Surface Ag: NEGATIVE

## 2016-12-15 LAB — HEPATITIS C ANTIBODY: Hep C Virus Ab: <0.1 s/co ratio (ref 0.0–0.9)

## 2016-12-15 LAB — TSH: TSH: 0.338 u[IU]/mL — AB (ref 0.450–4.500)

## 2016-12-16 DIAGNOSIS — F17200 Nicotine dependence, unspecified, uncomplicated: Secondary | ICD-10-CM | POA: Insufficient documentation

## 2016-12-16 NOTE — Assessment & Plan Note (Signed)
Not interested in prescription smoking cessation at this time. OTC nicotine replacement products are also an option. Encouragement provided.

## 2016-12-17 ENCOUNTER — Ambulatory Visit: Payer: 59 | Admitting: Physician Assistant

## 2016-12-17 DIAGNOSIS — Z111 Encounter for screening for respiratory tuberculosis: Secondary | ICD-10-CM

## 2016-12-17 LAB — TB SKIN TEST: TB Skin Test: NEGATIVE

## 2016-12-18 LAB — PAP IG, CT-NG, RFX HPV ASCU
Chlamydia, Nuc. Acid Amp: NEGATIVE
GONOCOCCUS BY NUCLEIC ACID AMP: NEGATIVE
PAP SMEAR COMMENT: 0

## 2016-12-19 ENCOUNTER — Telehealth: Payer: Self-pay | Admitting: Family Medicine

## 2016-12-19 NOTE — Addendum Note (Signed)
Addended by: Fernande BrasJEFFERY, Shaquisha Wynn S on: 12/19/2016 12:49 PM   Modules accepted: Orders

## 2016-12-19 NOTE — Telephone Encounter (Signed)
PT CALLING WANTING CHELLE TO GIVE HER A CALL ABOUT HER pap smear SHE DOESN'T UNDERSTAND THE RESULTS

## 2016-12-20 NOTE — Telephone Encounter (Signed)
Left message for pt to return call regarding lab results.  

## 2016-12-20 NOTE — Telephone Encounter (Signed)
Patient called back. I spoke with her about lab results. She had questions regarding Pap and TSH. Patient verbalized understanding.

## 2017-01-14 ENCOUNTER — Encounter: Payer: Self-pay | Admitting: Obstetrics and Gynecology

## 2017-02-06 ENCOUNTER — Ambulatory Visit: Payer: Medicaid Other | Admitting: Obstetrics and Gynecology

## 2017-02-13 ENCOUNTER — Encounter: Payer: Self-pay | Admitting: Medical

## 2017-02-13 ENCOUNTER — Ambulatory Visit (INDEPENDENT_AMBULATORY_CARE_PROVIDER_SITE_OTHER): Payer: Self-pay | Admitting: Medical

## 2017-02-13 VITALS — BP 101/78 | HR 75 | Wt 131.0 lb

## 2017-02-13 DIAGNOSIS — R87612 Low grade squamous intraepithelial lesion on cytologic smear of cervix (LGSIL): Secondary | ICD-10-CM

## 2017-02-13 NOTE — Patient Instructions (Signed)
Human Papillomavirus Human papillomavirus (HPV) is the most common sexually transmitted infection (STI). It is easy to pass it from person to person (contagious). HPV can cause cervical cancer, anal cancer, and genital warts. The genital warts can be seen and felt. Also, there may be wartlike regions in the throat. HPV may not have any symptoms. It is possible to have HPV for a long time and not know it. You may pass HPV on to others without knowing it. Follow these instructions at home:  Take medicines as told by your doctor.  Use over-the-counter creams for itching as told by your doctor.  Keep all follow-up visits. Make sure to get Pap tests as told by your doctor.  Do not touch or scratch the warts.  Do not treat genital warts with medicines used for treating hand warts.  Do not have sex while you are getting treatment.  Do not douche or use tampons during treatment of HPV.  Tell your sex partner about your infection because he or she may also need treatment.  If you get pregnant, tell your doctor that you had HPV. Your doctor will watch your pregnancy closely. This is important to keep your baby safe.  After treatment, use condoms during sex to prevent future infections.  Have only one sex partner.  Have a sex partner who does not have other sex partners. Contact a doctor if:  The treated skin is red, swollen, or painful.  You have a fever.  You feel ill.  You feel lumps or pimple-like areas in and around your genital area.  You have bleeding of the vagina or the area that was treated.  You have pain during sex. This information is not intended to replace advice given to you by your health care provider. Make sure you discuss any questions you have with your health care provider. Document Released: 04/19/2008 Document Revised: 10/13/2015 Document Reviewed: 08/12/2013 Elsevier Interactive Patient Education  2017 [employer]  

## 2017-02-13 NOTE — Progress Notes (Signed)
History:  Melanie Carr is a 26 y.o. female who was referred to clinic today for discussion about abnormal pap smear results. The patient had LSIL pap smear without HPV testing in July at [facility].    The following portions of the patient's history were reviewed and updated as appropriate: allergies, current medications, family history, past medical history, social history, past surgical history and problem list.  Review of Systems:  Review of Systems  Constitutional: Negative for fever.  Gastrointestinal: Negative for abdominal pain.  Genitourinary:       Neg - vaginal bleeding      Objective:  Physical Exam BP 101/78   Pulse 75   Wt 131 lb (59.4 kg)   BMI 21.80 kg/m  Physical Exam  Constitutional: She appears well-developed and well-nourished. No distress.  HENT:  Head: Normocephalic.  Cardiovascular: Normal rate.   Pulmonary/Chest: Effort normal.  Abdominal: Soft.  Skin: Skin is warm and dry. No erythema.  Psychiatric: She has a normal mood and affect. Her behavior is normal. Judgment and thought content normal.  Vitals reviewed.   Assessment & Plan:  1. LGSIL on Pap smear of cervix - Reviewed results with patient - Discussed role of HPV in cervical dysplasia - Advised that ASCCP recommends cotesting in 1 year and no need for colposcopy today - Patient encouraged to quit smoking and quit line information given - All questions answered - Patient to return to [facility] in 1 year for repeat pap with cotesting or sooner PRN   Marny Lowenstein, PA-C 02/13/2017 10:53 AM

## 2017-02-14 ENCOUNTER — Telehealth: Payer: Self-pay | Admitting: *Deleted

## 2017-02-14 NOTE — Telephone Encounter (Signed)
Melanie Carr left a message 02/13/17 am stating she had an appointment with Melanie Carr for abnormal pap results and wants to know if she can get another pap or retest . States she does not feel they were correct and they didn't give her an option to retest- just told her not to worry. States but she is concerned.   I called Melanie Carr back and we discussed her results and reccommedations that are based on national guidelines . I explained she can get another papsmear but her insurance may not cover it and she would be responsible for the bill.  She also asked about how accurate , etc. We discussed that we expect results are accurate but she can get retest if she feels she needs to .  She states she will call her insurance company and find out if they will cover it, and if not she may call the billing office to find out the estimate of what she would pay. She will call us back to schedule if she elects to retest.

## 2017-08-20 ENCOUNTER — Ambulatory Visit: Payer: 59 | Admitting: Physician Assistant

## 2017-08-21 ENCOUNTER — Other Ambulatory Visit: Payer: Self-pay

## 2017-08-21 ENCOUNTER — Ambulatory Visit: Payer: 59 | Admitting: Physician Assistant

## 2017-08-21 ENCOUNTER — Encounter: Payer: Self-pay | Admitting: Physician Assistant

## 2017-08-21 VITALS — BP 104/60 | HR 94 | Temp 98.8°F | Resp 16 | Ht 65.0 in | Wt 147.0 lb

## 2017-08-21 DIAGNOSIS — F17200 Nicotine dependence, unspecified, uncomplicated: Secondary | ICD-10-CM

## 2017-08-21 DIAGNOSIS — Z0184 Encounter for antibody response examination: Secondary | ICD-10-CM

## 2017-08-21 DIAGNOSIS — Z23 Encounter for immunization: Secondary | ICD-10-CM | POA: Diagnosis not present

## 2017-08-21 DIAGNOSIS — R7989 Other specified abnormal findings of blood chemistry: Secondary | ICD-10-CM | POA: Diagnosis not present

## 2017-08-21 MED ORDER — VARENICLINE TARTRATE 1 MG PO TABS: 1.0000 mg | ORAL_TABLET | Freq: Two times a day (BID) | ORAL | 5 refills | Status: DC

## 2017-08-21 NOTE — Progress Notes (Signed)
   Subjective:    Patient ID: Melanie Carr, female    DOB: [birth year], 27 y.o.   MRN: [id number] Chief Complaint  Patient presents with  . Immunizations    hep B gardisil and discuss Chantix     HPI  27 yo female presenting for vaccinations and to speak about chantix to help quit smoking.  Pt reports that she went to see her OB/GYN on 02/13/2017 for a pap and had LGSIL and was advised to f/u in a year for re-eval. Today she is interested in receiving gardasil, hep b boost and menveo.   Pt has tried lozenges and gum for cessation as well as "cold [place]" but has had no luck. Has increased to about 1/2 pack a day from 1/4 pack at last visit.    Review of Systems     Past Medical History:  Diagnosis Date  . Abnormal cervical cytology   . Bacterial vaginal infection   . Bronchitis   . Chlamydia   . Syphilis   . Trichomonosis    Patient Active Problem List   Diagnosis Date Noted  . Smoker 12/16/2016   No Known Allergies Prior to Admission medications   Medication Sig Start Date End Date Taking? Authorizing Provider  fluticasone (FLONASE) 50 MCG/ACT nasal spray Place 2 sprays into both nostrils daily. 02/10/14 10/20/14  Ozella RocksMerrell, David J, MD  ipratropium (ATROVENT) 0.06 % nasal spray Place 2 sprays into both nostrils 4 (four) times daily. 02/10/14 10/20/14  Ozella RocksMerrell, David J, MD  medroxyPROGESTERone (DEPO-PROVERA) 150 MG/ML injection Inject 150 mg into the muscle every 3 (three) months. Due for another shot in march  10/20/14  [provider]    Objective:   Physical Exam        Assessment & Plan:

## 2017-08-21 NOTE — Patient Instructions (Addendum)
CHANTIX Instructions: Select a QUIT date at least 7 days in the future. You should be taking the Chantix for at least 7 days before you stop smoking. Days 1-4: take 1/2 tablet (0.5 mg) each evening Days 5-8: take 1/2 tablet (0.5 mg) each morning and each evening Days 9-12: take 1/2 tablet (0.5 mg) each morning and 1 tablet (1 mg) each evening Days 13-on: Take 1 tablet (1 mg) each morning and each evening     IF you received an x-ray today, you will receive an invoice from [facility]. Please contact [facility] at [phone] with questions or concerns regarding your invoice.   IF you received labwork today, you will receive an invoice from [facility]. Please contact [facility] at [phone] with questions or concerns regarding your invoice.   Our billing staff will not be able to assist you with questions regarding bills from these companies.  You will be contacted with the lab results as soon as they are available. The fastest way to get your results is to activate your My Chart account. Instructions are located on the last page of this paperwork. If you have not heard from us regarding the results in 2 weeks, please contact this office.     Did you know that you begin to benefit from quitting smoking within the first twenty minutes? It's TRUE.  At 20 minutes: -blood pressure decreases -pulse rate drops -body temperature of hands and feet increases  At 8 hours: -carbon monoxide level in blood drops to normal -oxygen level in blood increases to normal  At 24 hours: -the chance of heart attack decreases  At 48 hours: -nerve endings start regrowing -ability to smell and taste is enhanced  2 weeks-3 months: -circulation improves -walking becomes easier -lung function improves  1-9 months: -coughing, sinus congestion, fatigue and shortness of breath decreases  1 year: -excess risk of heart disease is decreased to HALF that of a smoker  5 years: Stroke  risk is reduced to that of people who have never smoked  10 years: -risk of lung cancer drops to as little as half that of continuing smokers -risk of cancer of the mouth, throat, esophagus, bladder, kidney and pancreas decreases -risk of ulcer decreases  15 years -risk of heart disease is now similar to that of people who have never smoked -risk of death returns to nearly the level of people who have never smoked

## 2017-08-21 NOTE — Progress Notes (Signed)
Patient ID: Melanie Carr, female    DOB: 1991/03/28, 27 y.o.   MRN: [id number]  PCP: Harrison Mons, PA-C  Chief Complaint  Patient presents with  . Immunizations    hep B gardisil and discuss Chantix     Subjective:   Presents for evaluation of vaccinations and to discuss smoking cessation with Chantix.  I met her 11/2016 to establish care and perform evaluation for school. Hepatitis B titer was low, and she was advised to return for booster. In addition, she desires HPV vaccine series and meningococcal vaccine.  At that initial visit, TSH was low, and she was advised to return for T4 level, which she did not.  She is interested in trying Chantix to quit smoking. She has not successfully quit "cold [place]," using gum or lozenges. Recently increased from 1/4 PPD to 1/2 PPD.  Immunization History  Administered Date(s) Administered  . DTaP 08/04/1992, 10/06/1992, 12/01/1992, 03/22/1994  . Hepatitis B 06/23/1992, 12/01/1992, 03/09/1993  . HiB (PRP-OMP) 08/04/1992, 10/06/1992, 12/01/1992, 10/23/1993  . IPV 08/04/1992, 10/06/1992, 03/22/1994, 09/30/1997  . MMR 10/23/1993, 09/30/1997  . PPD Test 12/14/2016  . Tdap 12/14/2016  . Varicella 01/20/1996    Review of Systems None performed.    Patient Active Problem List   Diagnosis Date Noted  . Smoker 12/16/2016     Prior to Admission medications   Medication Sig Start Date End Date Taking? Authorizing Provider  fluticasone (FLONASE) 50 MCG/ACT nasal spray Place 2 sprays into both nostrils daily. 02/10/14 10/20/14  Waldemar Dickens, MD  ipratropium (ATROVENT) 0.06 % nasal spray Place 2 sprays into both nostrils 4 (four) times daily. 02/10/14 10/20/14  Waldemar Dickens, MD  medroxyPROGESTERone (DEPO-PROVERA) 150 MG/ML injection Inject 150 mg into the muscle every 3 (three) months. Due for another shot in march  10/20/14  [provider]     No Known Allergies     Objective:  Physical Exam  Constitutional:  She is oriented to person, place, and time. She appears well-developed and well-nourished. She is active and cooperative. No distress.  BP 104/60   Pulse 94   Temp 98.8 F (37.1 C)   Resp 16   Ht 5' 5"  (1.651 m)   Wt 147 lb (66.7 kg)   SpO2 98%   BMI 24.46 kg/m    Eyes: Conjunctivae are normal.  Pulmonary/Chest: Effort normal.  Neurological: She is alert and oriented to person, place, and time.  Psychiatric: She has a normal mood and affect. Her speech is normal and behavior is normal.           Assessment & Plan:   Problem List Items Addressed This Visit    Smoker   Relevant Medications   varenicline (CHANTIX) 1 MG tablet    Other Visit Diagnoses    Need for hepatitis B booster vaccination    -  Primary   Has completed inital series. booster dose today, with repeat titer in 4 weeks. If remains low, will complete 2nd series.   Relevant Orders   Hepatitis B surface antibody   Need for HPV vaccination       Dose #1 today. Return in 2 months for dose #2, 6 months for dose #3.   Relevant Orders   HPV 9-valent vaccine,Recombinat   Need for meningococcal vaccination       Relevant Orders   MENINGOCOCCAL MCV4O (Completed)   Immunity status testing       Repeat titer 4 weeks following booster today.  Relevant Orders   Hepatitis B vaccine adult IM (Completed)   Low TSH level       Update TSH and check T4 today.   Relevant Orders   TSH   T4       Return in about 1 month (around 09/18/2017) for Hep B titer.   Fara Chute, PA-C [facility]

## 2017-08-22 ENCOUNTER — Encounter: Payer: Self-pay | Admitting: Physician Assistant

## 2017-09-19 ENCOUNTER — Ambulatory Visit: Payer: 59

## 2017-09-19 DIAGNOSIS — Z23 Encounter for immunization: Secondary | ICD-10-CM

## 2017-09-19 DIAGNOSIS — R7989 Other specified abnormal findings of blood chemistry: Secondary | ICD-10-CM

## 2017-09-20 LAB — T4: T4, Total: 7.3 ug/dL (ref 4.5–12.0)

## 2017-09-20 LAB — TSH: TSH: 0.774 u[IU]/mL (ref 0.450–4.500)

## 2017-09-20 LAB — HEPATITIS B SURFACE ANTIBODY, QUANTITATIVE: Hepatitis B Surf Ab Quant: >1000 m[IU]/mL (ref >9.9)

## 2017-10-18 ENCOUNTER — Ambulatory Visit: Payer: 59 | Admitting: Physician Assistant

## 2017-10-23 ENCOUNTER — Ambulatory Visit: Payer: 59 | Admitting: Physician Assistant

## 2017-10-23 DIAGNOSIS — Z23 Encounter for immunization: Secondary | ICD-10-CM

## 2017-10-23 NOTE — Progress Notes (Unsigned)
gar

## 2018-03-02 ENCOUNTER — Ambulatory Visit (HOSPITAL_COMMUNITY)
Admission: EM | Admit: 2018-03-02 | Discharge: 2018-03-02 | Disposition: A | Discharge status: Home / Self Care | Payer: 59 | Attending: Family Medicine | Admitting: Family Medicine

## 2018-03-02 ENCOUNTER — Encounter (HOSPITAL_COMMUNITY): Payer: Self-pay | Admitting: Emergency Medicine

## 2018-03-02 DIAGNOSIS — Z202 Contact with and (suspected) exposure to infections with a predominantly sexual mode of transmission: Secondary | ICD-10-CM | POA: Diagnosis present

## 2018-03-02 DIAGNOSIS — Z113 Encounter for screening for infections with a predominantly sexual mode of transmission: Secondary | ICD-10-CM

## 2018-03-02 DIAGNOSIS — F17210 Nicotine dependence, cigarettes, uncomplicated: Secondary | ICD-10-CM | POA: Diagnosis not present

## 2018-03-02 MED ORDER — AZITHROMYCIN 250 MG PO TABS
ORAL_TABLET | ORAL | Status: AC
  Filled 2018-03-02: qty 4

## 2018-03-02 MED ORDER — AZITHROMYCIN 250 MG PO TABS
1000.0000 mg | ORAL_TABLET | Freq: Once | ORAL | Status: AC
  Administered 2018-03-02: 1000 mg via ORAL

## 2018-03-02 MED ORDER — CEFTRIAXONE SODIUM 250 MG IJ SOLR
INTRAMUSCULAR | Status: AC
  Filled 2018-03-02: qty 250

## 2018-03-02 MED ORDER — CEFTRIAXONE SODIUM 250 MG IJ SOLR
250.0000 mg | Freq: Once | INTRAMUSCULAR | Status: AC
  Administered 2018-03-02: 250 mg via INTRAMUSCULAR

## 2018-03-02 NOTE — ED Triage Notes (Signed)
Pt states her partner was tested for stds today, pt denies symptoms at this time but is concerned for stds herself.

## 2018-03-02 NOTE — Discharge Instructions (Addendum)
We will call you if any of the tests turn out positive.

## 2018-03-02 NOTE — ED Provider Notes (Signed)
[facility]    CSN: [id number] Arrival date & time: 03/02/18  1235     History   Chief Complaint Chief Complaint  Patient presents with  . Exposure to STD    HPI SHAOLIN Carr is a 27 y.o. female.    Pt states her partner was tested for stds today, pt denies symptoms at this time but is concerned for stds herself.  Patient has not had sex in a long time that was unprotected.  She did have oral sex recently.  (The oral sex was done on her)       Past Medical History:  Diagnosis Date  . Abnormal cervical cytology   . Bacterial vaginal infection   . Bronchitis   . Chlamydia   . Syphilis   . Trichomonosis     Patient Active Problem List   Diagnosis Date Noted  . Smoker 12/16/2016    History reviewed. No pertinent surgical history.  OB History   None      Home Medications    Prior to Admission medications   Medication Sig Start Date End Date Taking? Authorizing Provider  varenicline (CHANTIX) 1 MG tablet Take 1 tablet (1 mg total) by mouth 2 (two) times daily. 08/21/17   Porfirio Oar, PA  fluticasone (FLONASE) 50 MCG/ACT nasal spray Place 2 sprays into both nostrils daily. 02/10/14 10/20/14  Ozella Rocks, MD  ipratropium (ATROVENT) 0.06 % nasal spray Place 2 sprays into both nostrils 4 (four) times daily. 02/10/14 10/20/14  Ozella Rocks, MD  medroxyPROGESTERone (DEPO-PROVERA) 150 MG/ML injection Inject 150 mg into the muscle every 3 (three) months. Due for another shot in march  10/20/14  [provider]    Family History Family History  Problem Relation Age of Onset  . Cancer Father 26       Colon Cancer  . Hypertension Brother   . Hypertension Maternal Grandmother   . Diabetes Maternal Grandfather   . Hypertension Paternal Grandmother   . Hypertension Paternal Grandfather     Social History Social History   Tobacco Use  . Smoking status: Current Every Day Smoker    Packs/day: 0.25    Years: 0.50    Pack years: 0.12   Types: Cigarettes  . Smokeless tobacco: Never Used  . Tobacco comment: Would like to have help quitting smoking. Smoked 1 pack a day for 2.5 years previously  Substance Use Topics  . Alcohol use: Yes    Alcohol/week: 0.0 - 1.0 standard drinks  . Drug use: No     Allergies   Patient has no known allergies.   Review of Systems Review of Systems  All other systems reviewed and are negative.    Physical Exam Triage Vital Signs ED Triage Vitals [03/02/18 1247]  Enc Vitals Group     BP 128/88     Pulse Rate (!) 107     Resp 18     Temp 99.5 F (37.5 C)     Temp src      SpO2 99 %     Weight      Height      Head Circumference      Peak Flow      Pain Score 0     Pain Loc      Pain Edu?      Excl. in GC?    No data found.  Updated Vital Signs BP 128/88   Pulse (!) 107   Temp 99.5 F (37.5  C)   Resp 18   LMP 02/13/2018   SpO2 99%    Physical Exam  Constitutional: She appears well-developed and well-nourished.  HENT:  Right Ear: External ear normal.  Left Ear: External ear normal.  Mouth/Throat: Oropharynx is clear and moist.  Eyes: Pupils are equal, round, and reactive to light. Conjunctivae are normal.  Neck: Normal range of motion. Neck supple.  Pulmonary/Chest: Effort normal.  Musculoskeletal: Normal range of motion.  Neurological: She is alert.  Skin: Skin is warm and dry.  Nursing note and vitals reviewed.    UC Treatments / Results  Labs (all labs ordered are listed, but only abnormal results are displayed) Labs Reviewed - No data to display  EKG None  Radiology No results found.  Procedures Procedures (including critical care time)  Medications Ordered in UC Medications - No data to display  Initial Impression / Assessment and Plan / UC Course  I have reviewed the triage vital signs and the nursing notes.  Pertinent labs & imaging results that were available during my care of the patient were reviewed by me and considered in my  medical decision making (see chart for details).    Final Clinical Impressions(s) / UC Diagnoses   Final diagnoses:  None   Discharge Instructions   None    ED Prescriptions    None     Controlled Substance Prescriptions [place] Controlled Substance Registry consulted? Not Applicable   Elvina Sidle, MD 03/02/18 1324

## 2018-03-03 ENCOUNTER — Ambulatory Visit (INDEPENDENT_AMBULATORY_CARE_PROVIDER_SITE_OTHER): Payer: 59 | Admitting: Family Medicine

## 2018-03-03 ENCOUNTER — Other Ambulatory Visit: Payer: Self-pay

## 2018-03-03 ENCOUNTER — Encounter: Payer: Self-pay | Admitting: Family Medicine

## 2018-03-03 VITALS — BP 127/78 | HR 82 | Temp 98.6°F | Ht 65.0 in | Wt 142.4 lb

## 2018-03-03 DIAGNOSIS — Z23 Encounter for immunization: Secondary | ICD-10-CM

## 2018-03-03 DIAGNOSIS — Z8742 Personal history of other diseases of the female genital tract: Secondary | ICD-10-CM | POA: Insufficient documentation

## 2018-03-03 DIAGNOSIS — Z01419 Encounter for gynecological examination (general) (routine) without abnormal findings: Secondary | ICD-10-CM

## 2018-03-03 LAB — GC/CHLAMYDIA PROBE AMP (~~LOC~~) NOT AT ARMC
Chlamydia: NEGATIVE
Neisseria Gonorrhea: NEGATIVE

## 2018-03-03 LAB — RPR: RPR Ser Ql: NONREACTIVE

## 2018-03-03 MED ORDER — LEVONORGESTREL 1.5 MG PO TABS: 1.5000 mg | ORAL_TABLET | Freq: Once | ORAL | 2 refills | Status: AC

## 2018-03-03 NOTE — Patient Instructions (Addendum)
If you have lab work done today you will be contacted with your lab results within the next 2 weeks.  If you have not heard from [place] then please contact us. The fastest way to get your results is to register for My Chart.   IF you received an x-ray today, you will receive an invoice from [facility]. Please contact [facility] at [phone] with questions or concerns regarding your invoice.   IF you received labwork today, you will receive an invoice from [place]. Please contact [facility] at [phone] with questions or concerns regarding your invoice.   Our billing staff will not be able to assist you with questions regarding bills from these companies.  You will be contacted with the lab results as soon as they are available. The fastest way to get your results is to activate your My Chart account. Instructions are located on the last page of this paperwork. If you have not heard from [place] regarding the results in 2 weeks, please contact this office.    Emergency Contraception Emergency contraceptives prevent pregnancy after unprotected sexual intercourse. They can also be used:  When a condom breaks.  After a sexual assault.  If you forgot to take your birth control pills.  When inadequate protection occurs with sexual intercourse.  Usually, emergency contraception is a pill or combination of pills taken right after sex or up to 5 days after unprotected sex. It is most effective the sooner you take the pills after having sexual intercourse. Most types of emergency contraceptive pills are available without a prescription. One type requires a prescription from your health care provider. Also, young women under age 3 need a prescription for most types of emergency contraception. Check with your pharmacist. Do not use emergency contraception as your only form of birth control. These pills do not protect against sexually transmitted infections (STIs). Emergency  contraception will not work if you are already pregnant and will not harm the baby if you are pregnant. Emergency contraception does not cause an abortion. The pills work by preventing the ovaries from releasing an egg (ovulation) or the fertilization of an egg. Taking St. John's wort, certain antibiotic medicines, and certain anticonvulsant medicines may make these pills less effective. Discuss with your health care provider the possible side effects of emergency contraceptives. These may include:  Abdominal pain and cramps.  Breast tenderness.  Headache.  Dizziness.  Fatigue.  Irregular bleeding or spotting.  Types of emergency contraceptives  Some types of emergency contraceptive pills contain estrogen and progesterone in higher doses.  Some types just contain progesterone. They are available as a single pill or two pills taken 12-24 hours apart.  One type of pill is not a hormone. It prevents the hormone progesterone from having its normal effect on ovulation and the lining of the uterus.  An intrauterine device (IUD) may be used.This T-shaped device is also used as a form of birth control. It is inserted into the uterus to prevent pregnancy. The copper IUD can also be used as emergency contraception if inserted within 5 days of having unprotected intercourse. Follow these instructions at home:  Eat something before taking the emergency contraceptive pills.  Lie down for a couple of hours if you become tired or dizzy.  Continue using birth control until you start your menstrual period. Contact a health care provider if:  You throw up (vomit) within 2 hours after taking the pill. You will have to take another pill.  You  need treatment for nausea, vomiting, headache, or abdominal cramps.  You have not had a menstrual period 21 days after taking the pill.  You are having irregular bleeding or spotting. Get help right away if:  You have chest pain.  You have leg  pain.  You have numbness or weakness of your arms or legs.  You have slurred speech.  You have visual problems. This information is not intended to replace advice given to you by your health care provider. Make sure you discuss any questions you have with your health care provider. Document Released: 07/16/2001 Document Revised: 10/13/2015 Document Reviewed: 10/19/2012 Elsevier Interactive Patient Education  2017 [place] 18-39 Years, Female Preventive care refers to lifestyle choices and visits with your health care provider that can promote health and wellness. What does preventive care include?  A yearly physical exam. This is also called an annual well check.  Dental exams once or twice a year.  Routine eye exams. Ask your health care provider how often you should have your eyes checked.  Personal lifestyle choices, including: ? Daily care of your teeth and gums. ? Regular physical activity. ? Eating a healthy diet. ? Avoiding tobacco and drug use. ? Limiting alcohol use. ? Practicing safe sex. ? Taking vitamin and mineral supplements as recommended by your health care provider. What happens during an annual well check? The services and screenings done by your health care provider during your annual well check will depend on your age, overall health, lifestyle risk factors, and family history of disease. Counseling Your health care provider may ask you questions about your:  Alcohol use.  Tobacco use.  Drug use.  Emotional well-being.  Home and relationship well-being.  Sexual activity.  Eating habits.  Work and work Statistician.  Method of birth control.  Menstrual cycle.  Pregnancy history.  Screening You may have the following tests or measurements:  Height, weight, and BMI.  Diabetes screening. This is done by checking your blood sugar (glucose) after you have not eaten for a while (fasting).  Blood pressure.  Lipid and  cholesterol levels. These may be checked every 5 years starting at age 80.  Skin check.  Hepatitis C blood test.  Hepatitis B blood test.  Sexually transmitted disease (STD) testing.  BRCA-related cancer screening. This may be done if you have a family history of breast, ovarian, tubal, or peritoneal cancers.  Pelvic exam and Pap test. This may be done every 3 years starting at age 34. Starting at age 38, this may be done every 5 years if you have a Pap test in combination with an HPV test.  Discuss your test results, treatment options, and if necessary, the need for more tests with your health care provider. Vaccines Your health care provider may recommend certain vaccines, such as:  Influenza vaccine. This is recommended every year.  Tetanus, diphtheria, and acellular pertussis (Tdap, Td) vaccine. You may need a Td booster every 10 years.  Varicella vaccine. You may need this if you have not been vaccinated.  HPV vaccine. If you are 69 or younger, you may need three doses over 6 months.  Measles, mumps, and rubella (MMR) vaccine. You may need at least one dose of MMR. You may also need a second dose.  Pneumococcal 13-valent conjugate (PCV13) vaccine. You may need this if you have certain conditions and were not previously vaccinated.  Pneumococcal polysaccharide (PPSV23) vaccine. You may need one or two doses if you smoke cigarettes  or if you have certain conditions.  Meningococcal vaccine. One dose is recommended if you are age 72-21 years and a first-year college student living in a residence hall, or if you have one of several medical conditions. You may also need additional booster doses.  Hepatitis A vaccine. You may need this if you have certain conditions or if you travel or work in places where you may be exposed to hepatitis A.  Hepatitis B vaccine. You may need this if you have certain conditions or if you travel or work in places where you may be exposed to hepatitis  B.  Haemophilus influenzae type b (Hib) vaccine. You may need this if you have certain risk factors.  Talk to your health care provider about which screenings and vaccines you need and how often you need them. This information is not intended to replace advice given to you by your health care provider. Make sure you discuss any questions you have with your health care provider. Document Released: 07/03/2001 Document Revised: 01/25/2016 Document Reviewed: 03/08/2015 Elsevier Interactive Patient Education  [employer].

## 2018-03-03 NOTE — Progress Notes (Signed)
10/14/20198:45 AM  Melanie Carr Feb 24, 1991, 27 y.o. female [id number]  Chief Complaint  Patient presents with  . Annual Exam    tested for sti exposure yesterday, gien meds for treatment. Not sure of diagnosis yet    HPI:   Patient is a 27 y.o. female with past medical history significant for abnormal pap and tob use who presents today for CPE  Cervical Cancer Screening: LGSIL 11/2016, needs cotesting today HIV Screening: yesterday STI Screening:yesterday Seasonal Influenza Vaccination: declines Td/Tdap Vaccination: 2018 HPV: 08/2017. 10/2017, due for last HPV today Frequency of Dental evaluation: Q6 months Frequency of Eye evaluation: has not seen eye doctor in over a year, no concerns G0 Uses condoms, does not want to use BC, does not have rx for plan B Has tried OCP and depo and bleed for months at a time Her goal is to be abstinent until marriage Last unprotected sex was 7 days ago  Exercises regularly Clean diet Has slowed down smoking, 5-6 cig a day. Prior 1ppd Using chantix, helping, having side effects, but tolerable  Fall Risk  03/03/2018 08/21/2017 12/14/2016  Falls in the past year? No No No     Depression screen [facility] 2/9 03/03/2018 08/21/2017 12/14/2016  Decreased Interest 0 0 0  Down, Depressed, Hopeless 0 1 0  PHQ - 2 Score 0 1 0  Altered sleeping - 0 -  Tired, decreased energy - 1 -  Change in appetite - 0 -  Feeling bad or failure about yourself  - 0 -  Trouble concentrating - 0 -  Moving slowly or fidgety/restless - 0 -  Suicidal thoughts - 0 -  PHQ-9 Score - 2 -  Difficult doing work/chores - Not difficult at all -    No Known Allergies  Prior to Admission medications   Medication Sig Start Date End Date Taking? Authorizing Provider  varenicline (CHANTIX) 1 MG tablet Take 1 tablet (1 mg total) by mouth 2 (two) times daily. 08/21/17  Yes Porfirio Oar, PA    Past Medical History:  Diagnosis Date  . Abnormal cervical cytology   . Bacterial  vaginal infection   . Bronchitis   . Chlamydia   . Syphilis   . Trichomonosis     History reviewed. No pertinent surgical history.  Social History   Tobacco Use  . Smoking status: Current Every Day Smoker    Packs/day: 0.25    Years: 0.50    Pack years: 0.12    Types: Cigarettes  . Smokeless tobacco: Never Used  . Tobacco comment: Would like to have help quitting smoking. Smoked 1 pack a day for 2.5 years previously  Substance Use Topics  . Alcohol use: Yes    Alcohol/week: 0.0 - 1.0 standard drinks    Family History  Problem Relation Age of Onset  . Cancer Father 38       Colon Cancer  . Hypertension Brother   . Hypertension Maternal Grandmother   . Diabetes Maternal Grandfather   . Hypertension Paternal Grandmother   . Hypertension Paternal Grandfather     Review of Systems  Constitutional: Negative for chills and fever.  Respiratory: Negative for cough and shortness of breath.   Cardiovascular: Negative for chest pain, palpitations and leg swelling.  Gastrointestinal: Negative for abdominal pain, nausea and vomiting.  All other systems reviewed and are negative.    OBJECTIVE:  Blood pressure 127/78, pulse 82, temperature 98.6 F (37 C), temperature source Oral, height 5\' 5"  (1.651 m), weight 142  lb 6.4 oz (64.6 kg), last menstrual period 02/13/2018, SpO2 98 %. Body mass index is 23.7 kg/m.    Visual Acuity Screening   Right eye Left eye Both eyes  Without correction: 20/30 20/20 20/15   With correction:       Physical Exam  Constitutional: She is oriented to person, place, and time. She appears well-developed and well-nourished.  HENT:  Head: Normocephalic and atraumatic.  Right Ear: Hearing, tympanic membrane, external ear and ear canal normal.  Left Ear: Hearing, tympanic membrane, external ear and ear canal normal.  Mouth/Throat: Oropharynx is clear and moist.  Eyes: Pupils are equal, round, and reactive to light. Conjunctivae and EOM are normal.    Neck: Neck supple. No thyromegaly present.  Cardiovascular: Normal rate, regular rhythm, normal heart sounds and intact distal pulses. Exam reveals no gallop and no friction rub.  No murmur heard. Pulmonary/Chest: Effort normal and breath sounds normal. She has no wheezes. She has no rhonchi. She has no rales. Right breast exhibits no inverted nipple, no mass, no nipple discharge, no skin change and no tenderness. Left breast exhibits no inverted nipple, no mass, no nipple discharge, no skin change and no tenderness. Breasts are symmetrical.  Abdominal: Soft. Bowel sounds are normal. She exhibits no distension. There is no hepatosplenomegaly. There is no tenderness. There is no guarding.  Genitourinary: There is no rash or lesion on the right labia. There is no rash or lesion on the left labia. Uterus is not enlarged, not fixed and not tender. Cervix exhibits no motion tenderness, no discharge and no friability. Right adnexum displays no mass and no tenderness. Left adnexum displays no mass and no tenderness. No erythema in the vagina. No vaginal discharge found.  Musculoskeletal: Normal range of motion. She exhibits no edema.  Lymphadenopathy:    She has no cervical adenopathy.    She has no axillary adenopathy.       Right: No supraclavicular adenopathy present.       Left: No supraclavicular adenopathy present.  Neurological: She is alert and oriented to person, place, and time. She has normal reflexes.  Skin: Skin is warm and dry.  Psychiatric: She has a normal mood and affect.  Nursing note and vitals reviewed.    ASSESSMENT and PLAN  1. Encounter for annual routine gynecological examination Routine HCM labs ordered. HCM reviewed/discussed. Anticipatory guidance regarding healthy weight, lifestyle and choices given.   - Pap IG and HPV (high risk) DNA detection  2. H/O abnormal cervical Papanicolaou smear - Pap IG and HPV (high risk) DNA detection  3. Need for HPV  vaccination  Other orders - HPV 9-valent vaccine,Recombinat - levonorgestrel (PLAN B 1-STEP) 1.5 MG tablet; Take 1 tablet (1.5 mg total) by mouth once for 1 dose.  Return for pending pap results.    Myles Lipps, MD Primary Care at [facility] [address] [place], [place] [address] Ph.  [phone] Fax [phone]

## 2018-03-04 LAB — HIV ANTIBODY (ROUTINE TESTING W REFLEX): HIV Screen 4th Generation wRfx: NONREACTIVE

## 2018-03-06 LAB — PAP IG AND HPV HIGH-RISK
HPV, high-risk: NEGATIVE
PAP Smear Comment: 0

## 2018-03-31 ENCOUNTER — Telehealth: Payer: Self-pay | Admitting: Family Medicine

## 2018-03-31 NOTE — Telephone Encounter (Signed)
Copied from CRM [id number]. Topic: Quick Communication - See Telephone Encounter >> Mar 31, 2018 12:48 PM Lorrine Kin, [place] wrote: CRM for notification. See Telephone encounter for: 03/31/18. Patient calling and states that she did not have any blood work done at her CPE on 03/03/18. Would like to know why? And to see if she could come get them done? Please advise. CB#: [phone]

## 2018-04-02 NOTE — Telephone Encounter (Signed)
She told me she had done her STD testing the day before, so no need to repeat. She had normal labs done last year. According to guidelines per [place] Preventive Services Task Force, no further screening is recommended at her age. Thanks

## 2018-04-03 NOTE — Telephone Encounter (Signed)
Spoke with pt and relayed message. Pt understood.

## 2018-07-28 ENCOUNTER — Ambulatory Visit: Payer: 59 | Admitting: Family Medicine

## 2018-07-28 ENCOUNTER — Encounter: Payer: Self-pay | Admitting: Family Medicine

## 2018-07-28 ENCOUNTER — Other Ambulatory Visit: Payer: Self-pay

## 2018-07-28 VITALS — BP 111/73 | HR 80 | Temp 98.3°F | Ht 65.0 in | Wt 143.0 lb

## 2018-07-28 DIAGNOSIS — E049 Nontoxic goiter, unspecified: Secondary | ICD-10-CM

## 2018-07-28 DIAGNOSIS — L680 Hirsutism: Secondary | ICD-10-CM | POA: Diagnosis not present

## 2018-07-28 NOTE — Progress Notes (Signed)
3/9/202012:03 PM  Melanie Carr July 27, 1990, 28 y.o. female [id number]  Chief Complaint  Patient presents with  . Hirsutism    having growth under chin, around the mouth and stomach. Just shaves legs and tweezers for now. Just concerned    HPI:   Patient is a 28 y.o. female who presents today for concerns of abnormal hair growth  Has been noticing more hair growth of arms, neck, chin, along jaw line, abdomen, has been plucking for past several months Her sister is also hairy Menses are regular, first 3 days are very heavy but then become light, last 7 days No nipple discharge Only thing different diet wise is eating more red meat Feels occ neck swelling but no tenderness Sometimes it is hard to swallow Has gained about 10-15 lbs this past year, this has been intentional, feels cloth are tighter Having daily headaches, but this only started after she started cutting back on caffeine Denies any vision changes  Wt Readings from Last 3 Encounters:  07/28/18 143 lb (64.9 kg)  03/03/18 142 lb 6.4 oz (64.6 kg)  08/21/17 147 lb (66.7 kg)    Fall Risk  07/28/2018 03/03/2018 08/21/2017 12/14/2016  Falls in the past year? 0 No No No  Number falls in past yr: 0 - - -  Injury with Fall? 0 - - -     Depression screen [facility] 2/9 07/28/2018 03/03/2018 08/21/2017  Decreased Interest 0 0 0  Down, Depressed, Hopeless 0 0 1  PHQ - 2 Score 0 0 1  Altered sleeping - - 0  Tired, decreased energy - - 1  Change in appetite - - 0  Feeling bad or failure about yourself  - - 0  Trouble concentrating - - 0  Moving slowly or fidgety/restless - - 0  Suicidal thoughts - - 0  PHQ-9 Score - - 2  Difficult doing work/chores - - Not difficult at all    No Known Allergies  Prior to Admission medications   Not on File    Past Medical History:  Diagnosis Date  . Abnormal cervical cytology   . Bacterial vaginal infection   . Bronchitis   . Chlamydia   . Syphilis   . Trichomonosis     History  reviewed. No pertinent surgical history.  Social History   Tobacco Use  . Smoking status: Current Every Day Smoker    Packs/day: 0.25    Years: 0.50    Pack years: 0.12    Types: Cigarettes  . Smokeless tobacco: Never Used  . Tobacco comment: Would like to have help quitting smoking. Smoked 1 pack a day for 2.5 years previously  Substance Use Topics  . Alcohol use: Yes    Alcohol/week: 0.0 - 1.0 standard drinks    Family History  Problem Relation Age of Onset  . Cancer Father 58       Colon Cancer  . Hypertension Brother   . Hypertension Maternal Grandmother   . Diabetes Maternal Grandfather   . Hypertension Paternal Grandmother   . Hypertension Paternal Grandfather     ROS Per hpi  OBJECTIVE:  Blood pressure 111/73, pulse 80, temperature 98.3 F (36.8 C), temperature source Oral, height 5\' 5"  (1.651 m), weight 143 lb (64.9 kg), SpO2 100 %. Body mass index is 23.8 kg/m.   Physical Exam Vitals signs and nursing note reviewed.  Constitutional:      Appearance: She is well-developed.  HENT:     Head: Normocephalic and atraumatic.  Eyes:     General: No scleral icterus.    Conjunctiva/sclera: Conjunctivae normal.     Pupils: Pupils are equal, round, and reactive to light.  Neck:     Musculoskeletal: Neck supple.     Thyroid: Thyromegaly present. No thyroid mass or thyroid tenderness.  Pulmonary:     Effort: Pulmonary effort is normal.  Skin:    General: Skin is warm and dry.  Neurological:     Mental Status: She is alert and oriented to person, place, and time.      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 Goiter - US Soft Tissue Head/Neck; Future  Return in about 4 weeks (around 08/25/2018) for followup up results.    Myles Lipps, MD Primary Care at [facility] [address] [place], [place] [address] Ph.  [phone] Fax  [phone]

## 2018-07-28 NOTE — Patient Instructions (Signed)
° ° ° °  If you have lab work done today you will be contacted with your lab results within the next 2 weeks.  If you have not heard from us then please contact us. The fastest way to get your results is to register for My Chart. ° ° °IF you received an x-ray today, you will receive an invoice from [facility]. Please contact [facility] at [phone] with questions or concerns regarding your invoice.  ° °IF you received labwork today, you will receive an invoice from [facility]. Please contact [facility] at [phone] with questions or concerns regarding your invoice.  ° °Our billing staff will not be able to assist you with questions regarding bills from these companies. ° °You will be contacted with the lab results as soon as they are available. The fastest way to get your results is to activate your My Chart account. Instructions are located on the last page of this paperwork. If you have not heard from us regarding the results in 2 weeks, please contact this office. °  ° ° ° °

## 2018-07-30 LAB — COMPREHENSIVE METABOLIC PANEL
ALT: 10 IU/L (ref 0–32)
AST: 22 IU/L (ref 0–40)
Albumin/Globulin Ratio: 1.9 (ref 1.2–2.2)
Albumin: 4.8 g/dL (ref 3.9–5.0)
Alkaline Phosphatase: 51 IU/L (ref 39–117)
BUN/Creatinine Ratio: 10 (ref 9–23)
BUN: 8 mg/dL (ref 6–20)
Bilirubin Total: 0.4 mg/dL (ref 0.0–1.2)
CO2: 22 mmol/L (ref 20–29)
Calcium: 9.4 mg/dL (ref 8.7–10.2)
Chloride: 102 mmol/L (ref 96–106)
Creatinine, Ser: 0.82 mg/dL (ref 0.57–1.00)
GFR calc Af Amer: 114 mL/min/{1.73_m2} (ref >59)
GFR calc non Af Amer: 99 mL/min/{1.73_m2} (ref >59)
Globulin, Total: 2.5 g/dL (ref 1.5–4.5)
Glucose: 78 mg/dL (ref 65–99)
Potassium: 4.6 mmol/L (ref 3.5–5.2)
Sodium: 138 mmol/L (ref 134–144)
Total Protein: 7.3 g/dL (ref 6.0–8.5)

## 2018-07-30 LAB — DHEA-SULFATE: DHEA-SO4: 375.1 ug/dL (ref 84.8–378.0)

## 2018-07-30 LAB — TESTOSTERONE,FREE AND TOTAL
Testosterone, Free: 2.3 pg/mL (ref 0.0–4.2)
Testosterone: 41 ng/dL (ref 8–48)

## 2018-07-30 LAB — PROLACTIN: Prolactin: 10.7 ng/mL (ref 4.8–23.3)

## 2018-07-30 LAB — 17-HYDROXYPROGESTERONE: 17-Hydroxyprogesterone: 46 ng/dL

## 2018-07-30 LAB — TSH: TSH: 0.797 u[IU]/mL (ref 0.450–4.500)

## 2018-08-01 ENCOUNTER — Ambulatory Visit
Admission: RE | Admit: 2018-08-01 | Discharge: 2018-08-01 | Disposition: A | Discharge status: Home / Self Care | Payer: 59 | Source: Ambulatory Visit | Attending: Family Medicine | Admitting: Family Medicine

## 2018-08-01 DIAGNOSIS — E049 Nontoxic goiter, unspecified: Secondary | ICD-10-CM

## 2018-08-07 ENCOUNTER — Telehealth: Payer: Self-pay | Admitting: Radiology

## 2018-08-07 ENCOUNTER — Encounter: Payer: Self-pay | Admitting: Family Medicine

## 2018-08-07 NOTE — Telephone Encounter (Signed)
The patient returned my call, I informed her of [place] results. She is going to address all questions directly to Dr. Leretha Pol, on "My Chart".

## 2018-08-08 ENCOUNTER — Telehealth: Payer: Self-pay | Admitting: *Deleted

## 2018-08-08 NOTE — Telephone Encounter (Signed)
Patient's concern/request has been addressed 

## 2018-09-08 ENCOUNTER — Telehealth (INDEPENDENT_AMBULATORY_CARE_PROVIDER_SITE_OTHER): Payer: 59 | Admitting: Family Medicine

## 2018-09-08 ENCOUNTER — Other Ambulatory Visit: Payer: Self-pay

## 2018-09-08 DIAGNOSIS — L680 Hirsutism: Secondary | ICD-10-CM | POA: Insufficient documentation

## 2018-09-08 DIAGNOSIS — E042 Nontoxic multinodular goiter: Secondary | ICD-10-CM | POA: Insufficient documentation

## 2018-09-08 NOTE — Progress Notes (Signed)
1 month follow up on thyroid, no travel, anxiety or depression. Labs were taken on March.  Pharmacy verified

## 2018-09-08 NOTE — Progress Notes (Signed)
Virtual Visit via telephone Note  I connected with patient on 09/08/18 at 829am by video coneference and verified that I am speaking with the correct person using two identifiers. Melanie Carr is currently located at home and patient is currently with her during visit. The provider, Myles Lipps, MD is located in their office at time of visit.  I discussed the limitations, risks, security and privacy concerns of performing an evaluation and management service by telephone and the availability of in person appointments. I also discussed with the patient that there may be a patient responsible charge related to this service. The patient expressed understanding and agreed to proceed.  CC: followup on labs and [place] results  HPI Patient doing well Labs and [place] reviewed Normal labs for hirustism Thyroid US done for clinical thyromegaly - borderline multinodular goiter, normal TSH?  Fall Risk  09/08/2018 07/28/2018 03/03/2018 08/21/2017 12/14/2016  Falls in the past year? 0 0 No No No  Number falls in past yr: 0 0 - - -  Injury with Fall? 0 0 - - -     Depression screen [facility] 2/9 09/08/2018 07/28/2018 03/03/2018  Decreased Interest 0 0 0  Down, Depressed, Hopeless 0 0 0  PHQ - 2 Score 0 0 0  Altered sleeping - - -  Tired, decreased energy - - -  Change in appetite - - -  Feeling bad or failure about yourself  - - -  Trouble concentrating - - -  Moving slowly or fidgety/restless - - -  Suicidal thoughts - - -  PHQ-9 Score - - -  Difficult doing work/chores - - -    No Known Allergies  Prior to Admission medications   Not on File    Past Medical History:  Diagnosis Date  . Abnormal cervical cytology   . Bacterial vaginal infection   . Bronchitis   . Chlamydia   . Syphilis   . Trichomonosis     No past surgical history on file.  Social History   Tobacco Use  . Smoking status: Current Every Day Smoker    Packs/day: 0.25    Years: 0.50    Pack years: 0.12    Types:  Cigarettes  . Smokeless tobacco: Never Used  . Tobacco comment: Would like to have help quitting smoking. Smoked 1 pack a day for 2.5 years previously  Substance Use Topics  . Alcohol use: Yes    Alcohol/week: 0.0 - 1.0 standard drinks    Family History  Problem Relation Age of Onset  . Cancer Father 81       Colon Cancer  . Hypertension Brother   . Hypertension Maternal Grandmother   . Diabetes Maternal Grandfather   . Hypertension Paternal Grandmother   . Hypertension Paternal Grandfather     ROS Per hpi  Objective  Vitals as reported by the patient: none  There were no vitals filed for this visit.  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 Multinodular goiter (nontoxic) Normal TSH, recheck in 1 year or sooner if needed. Discussed ssx of concern with RTC precautions.   FOLLOW-UP: 1 year   The above assessment and management plan was discussed with the patient. The patient verbalized understanding of and has agreed to the management plan. Patient is aware to call the clinic if symptoms persist or worsen. Patient is aware when to return to the clinic for a follow-up visit. Patient educated on when it  is appropriate to go to the emergency department.    I provided 14 minutes of non-face-to-face time during this encounter.  Myles LippsIrma M Santiago, MD Primary Care at [facility] [address] [place], [place] [address] Ph.  [phone] Fax [phone]

## 2018-09-11 ENCOUNTER — Ambulatory Visit: Payer: 59 | Admitting: Family Medicine

## 2018-11-21 ENCOUNTER — Ambulatory Visit: Payer: 59 | Admitting: Family Medicine

## 2018-11-24 ENCOUNTER — Ambulatory Visit (INDEPENDENT_AMBULATORY_CARE_PROVIDER_SITE_OTHER): Payer: 59 | Admitting: Family Medicine

## 2018-11-24 ENCOUNTER — Other Ambulatory Visit (HOSPITAL_COMMUNITY)
Admission: RE | Admit: 2018-11-24 | Discharge: 2018-11-24 | Disposition: A | Discharge status: Home / Self Care | Payer: 59 | Source: Ambulatory Visit | Attending: Family Medicine | Admitting: Family Medicine

## 2018-11-24 ENCOUNTER — Encounter: Payer: Self-pay | Admitting: Family Medicine

## 2018-11-24 ENCOUNTER — Other Ambulatory Visit: Payer: Self-pay

## 2018-11-24 VITALS — BP 111/74 | HR 94 | Temp 98.7°F | Ht 65.0 in | Wt 143.2 lb

## 2018-11-24 DIAGNOSIS — Z803 Family history of malignant neoplasm of breast: Secondary | ICD-10-CM | POA: Diagnosis not present

## 2018-11-24 DIAGNOSIS — F17200 Nicotine dependence, unspecified, uncomplicated: Secondary | ICD-10-CM

## 2018-11-24 DIAGNOSIS — Z113 Encounter for screening for infections with a predominantly sexual mode of transmission: Secondary | ICD-10-CM | POA: Diagnosis present

## 2018-11-24 DIAGNOSIS — Z87898 Personal history of other specified conditions: Secondary | ICD-10-CM | POA: Diagnosis not present

## 2018-11-24 MED ORDER — BUPROPION HCL ER (SR) 150 MG PO TB12: 150.0000 mg | ORAL_TABLET | Freq: Two times a day (BID) | ORAL | 2 refills | Status: DC

## 2018-11-24 NOTE — Progress Notes (Signed)
7/6/20208:41 AM  Melanie Carr Sep 03, 1990, 28 y.o., female [id number]  Chief Complaint  Patient presents with  . Mass    mom just got dx with breast cancer, felt lump during cycle. Does not feel anymore, just wants to chk  . SEXUALLY TRANSMITTED DISEASE    screening, does not feel shes's been exposed    HPI:   Patient is a 28 y.o. female with past medical history significant for who presents today for concerns of breast lump that she felt about 2 weeks while on her period, lump has resolved since then  Patient denies any nipple discharge, breast skin changes, swollen glands Mother diagnosed with breast cancer at age 64, this year Maternal aunt also with h/o breast cancer ~ 11s Patient unsure of any genetic testing Patient unaware of any ovarian cancer is her family history  Patient is planning on quitting smoking, has moved to a non smoking environment, down to 2-3 cig a day Tried chantix in the past - gave her weird dreams, nocorrette gum does noo work, has been using regular gum which seems to help more, wondering about other measures  Requesting std testing, routine, tries to do every 6 months or so Has started a new relationship Denies any vaginal discharge, pelvic pain, abnormal vaginal bleeding, etc Uses condoms as [facility]  Depression screen [facility] 2/9 11/24/2018 09/08/2018 07/28/2018  Decreased Interest 0 0 0  Down, Depressed, Hopeless 0 0 0  PHQ - 2 Score 0 0 0  Altered sleeping - - -  Tired, decreased energy - - -  Change in appetite - - -  Feeling bad or failure about yourself  - - -  Trouble concentrating - - -  Moving slowly or fidgety/restless - - -  Suicidal thoughts - - -  PHQ-9 Score - - -  Difficult doing work/chores - - -    Fall Risk  11/24/2018 11/24/2018 09/08/2018 07/28/2018 03/03/2018  Falls in the past year? 0 0 0 0 No  Number falls in past yr: 0 0 0 0 -  Injury with Fall? 0 0 0 0 -     No Known Allergies  Prior to Admission medications   Not on File     Past Medical History:  Diagnosis Date  . Abnormal cervical cytology   . Bacterial vaginal infection   . Bronchitis   . Chlamydia   . Syphilis   . Trichomonosis     History reviewed. No pertinent surgical history.  Social History   Tobacco Use  . Smoking status: Current Every Day Smoker    Packs/day: 0.25    Years: 0.50    Pack years: 0.12    Types: Cigarettes  . Smokeless tobacco: Never Used  . Tobacco comment: Would like to have help quitting smoking. Smoked 1 pack a day for 2.5 years previously  Substance Use Topics  . Alcohol use: Yes    Alcohol/week: 0.0 - 1.0 standard drinks    Family History  Problem Relation Age of Onset  . Cancer Father 93       Colon Cancer  . Hypertension Brother   . Hypertension Maternal Grandmother   . Diabetes Maternal Grandfather   . Hypertension Paternal Grandmother   . Hypertension Paternal Grandfather     ROS Per hpi  OBJECTIVE:  Today's Vitals   11/24/18 0833  BP: 111/74  Pulse: 94  Temp: 98.7 F (37.1 C)  TempSrc: Oral  SpO2: 100%  Weight: 143 lb 3.2 oz (65 kg)  Height: 5\' 5"  (1.651 m)   Body mass index is 23.83 kg/m.   Physical Exam Vitals signs and nursing note reviewed. Exam conducted with a chaperone present.  Constitutional:      Appearance: She is well-developed.  HENT:     Head: Normocephalic and atraumatic.  Eyes:     General: No scleral icterus.    Conjunctiva/sclera: Conjunctivae normal.     Pupils: Pupils are equal, round, and reactive to light.  Neck:     Musculoskeletal: Neck supple.  Pulmonary:     Effort: Pulmonary effort is normal.  Chest:     Breasts:        Right: No mass, nipple discharge or skin change.        Left: No mass, nipple discharge or skin change.  Lymphadenopathy:     Upper Body:     Right upper body: No supraclavicular, axillary or pectoral adenopathy.     Left upper body: No supraclavicular, axillary or pectoral adenopathy.  Skin:    General: Skin is warm and  dry.  Neurological:     Mental Status: She is alert and oriented to person, place, and time.      ASSESSMENT and PLAN  1. Routine screening for STI (sexually transmitted infection) Advised consistent condom use - RPR - HIV Antibody (routine testing w rflx) - Hepatitis C antibody - Urine cytology ancillary only  2. Smoker Smoking cessation instruction/counseling given 3-10 minutes:  counseled patient on the dangers of tobacco use, advised patient to stop smoking, and reviewed strategies to maximize success  Starting rx for Wellbutrin, reviewed r/se/b  3. H/O breast lump Resolved, cyclical, discussed breast self exam, handout given  4. Family history of breast cancer in first degree relative - Ambulatory referral to Genetics  Other orders - buPROPion (WELLBUTRIN SR) 150 MG 12 hr tablet; Take 1 tablet (150 mg total) by mouth 2 (two) times daily.  Return in about 3 months (around 02/24/2019) for CPE.    Myles LippsIrma M Santiago, MD Primary Care at [facility] [address] [place], [place] [address] Ph.  [phone] Fax [phone]

## 2018-11-24 NOTE — Patient Instructions (Addendum)
If you have lab work done today you will be contacted with your lab results within the next 2 weeks.  If you have not heard from [place] then please contact us. The fastest way to get your results is to register for My Chart.   IF you received an x-ray today, you will receive an invoice from [facility]. Please contact [facility] at [phone] with questions or concerns regarding your invoice.   IF you received labwork today, you will receive an invoice from [facility]. Please contact [facility] at [phone] with questions or concerns regarding your invoice.   Our billing staff will not be able to assist you with questions regarding bills from these companies.  You will be contacted with the lab results as soon as they are available. The fastest way to get your results is to activate your My Chart account. Instructions are located on the last page of this paperwork. If you have not heard from [place] regarding the results in 2 weeks, please contact this office.     Breast Self-Awareness Breast self-awareness means being familiar with how your breasts look and feel. It involves checking your breasts regularly and reporting any changes to your health care provider. Practicing breast self-awareness is important. Sometimes changes may not be harmful (are benign), but sometimes a change in your breasts can be a sign of a serious medical problem. It is important to learn how to do this procedure correctly so that you can catch problems early, when treatment is more likely to be successful. All women should practice breast self-awareness, including women who have had breast implants. What you need:  A mirror.  A well-lit room. How to do a breast self-exam A breast self-exam is one way to learn what is normal for your breasts and whether your breasts are changing. To do a breast self-exam: Look for changes  1. Remove all the clothing above your waist. 2. Stand in front of a mirror  in a room with good lighting. 3. Put your hands on your hips. 4. Push your hands firmly downward. 5. Compare your breasts in the mirror. Look for differences between them (asymmetry), such as: ? Differences in shape. ? Differences in size. ? Puckers, dips, and bumps in one breast and not the other. 6. Look at each breast for changes in the skin, such as: ? Redness. ? Scaly areas. 7. Look for changes in your nipples, such as: ? Discharge. ? Bleeding. ? Dimpling. ? Redness. ? A change in position. Feel for changes Carefully feel your breasts for lumps and changes. It is best to do this while lying on your back on the floor, and again while sitting or standing in the tub or shower with soapy water on your skin. Feel each breast in the following way: 1. Place the arm on the side of the breast you are examining above your head. 2. Feel your breast with the other hand. 3. Start in the nipple area and make -inch (2 cm) overlapping circles to feel your breast. Use the pads of your three middle fingers to do this. Apply light pressure, then medium pressure, then firm pressure. The light pressure will allow you to feel the tissue closest to the skin. The medium pressure will allow you to feel the tissue that is a little deeper. The firm pressure will allow you to feel the tissue close to the ribs. 4. Continue the overlapping circles, moving downward over the breast until you feel your  ribs below your breast. 5. Move one finger-width toward the center of the body. Continue to use the -inch (2 cm) overlapping circles to feel your breast as you move slowly up toward your collarbone. 6. Continue the up-and-down exam using all three pressures until you reach your armpit.  Write down what you find Writing down what you find can help you remember what to discuss with your health care provider. Write down:  What is normal for each breast.  Any changes that you find in each breast, including: ? The  kind of changes you find. ? Any pain or tenderness. ? Size and location of any lumps.  Where you are in your menstrual cycle, if you are still menstruating. General tips and recommendations  Examine your breasts every month.  If you are breastfeeding, the best time to examine your breasts is after a feeding or after using a breast pump.  If you menstruate, the best time to examine your breasts is 5-7 days after your period. Breasts are generally lumpier during menstrual periods, and it may be more difficult to notice changes.  With time and practice, you will become more familiar with the variations in your breasts and more comfortable with the exam. Contact a health care provider if you:  See a change in the shape or size of your breasts or nipples.  See a change in the skin of your breast or nipples, such as a reddened or scaly area.  Have unusual discharge from your nipples.  Find a lump or thick area that was not there before.  Have pain in your breasts.  Have any concerns related to your breast health. Summary  Breast self-awareness includes looking for physical changes in your breasts, as well as feeling for any changes within your breasts.  Breast self-awareness should be performed in front of a mirror in a well-lit room.  You should examine your breasts every month. If you menstruate, the best time to examine your breasts is 5-7 days after your menstrual period.  Let your health care provider know of any changes you notice in your breasts, including changes in size, changes on the skin, pain or tenderness, or unusual fluid from your nipples. This information is not intended to replace advice given to you by your health care provider. Make sure you discuss any questions you have with your health care provider. Document Released: 05/07/2005 Document Revised: 12/24/2017 Document Reviewed: 12/24/2017 Elsevier Patient Education  2020 [employer]    Coping with Quitting  Smoking  Quitting smoking is a physical and mental challenge. You will face cravings, withdrawal symptoms, and temptation. Before quitting, work with your health care provider to make a plan that can help you cope. Preparation can help you quit and keep you from giving in. How can I cope with cravings? Cravings usually last for 5-10 minutes. If you get through it, the craving will pass. Consider taking the following actions to help you cope with cravings:  Keep your mouth busy: ? Chew sugar-free gum. ? Suck on hard candies or a straw. ? Brush your teeth.  Keep your hands and body busy: ? Immediately change to a different activity when you feel a craving. ? Squeeze or play with a ball. ? Do an activity or a hobby, like making bead jewelry, practicing needlepoint, or working with wood. ? Mix up your normal routine. ? Take a short exercise break. Go for a quick walk or run up and down stairs. ? Spend time in  public places where smoking is not allowed.  Focus on doing something kind or helpful for someone else.  Call a friend or family member to talk during a craving.  Join a support group.  Call a quit line, such as 1-800-QUIT-NOW.  Talk with your health care provider about medicines that might help you cope with cravings and make quitting easier for you. How can I deal with withdrawal symptoms? Your body may experience negative effects as it tries to get used to not having nicotine in the system. These effects are called withdrawal symptoms. They may include:  Feeling hungrier than normal.  Trouble concentrating.  Irritability.  Trouble sleeping.  Feeling depressed.  Restlessness and agitation.  Craving a cigarette. To manage withdrawal symptoms:  Avoid places, people, and activities that trigger your cravings.  Remember why you want to quit.  Get plenty of sleep.  Avoid coffee and other caffeinated drinks. These may worsen some of your symptoms. How can I handle  social situations? Social situations can be difficult when you are quitting smoking, especially in the first few weeks. To manage this, you can:  Avoid parties, bars, and other social situations where people might be smoking.  Avoid alcohol.  Leave right away if you have the urge to smoke.  Explain to your family and friends that you are quitting smoking. Ask for understanding and support.  Plan activities with friends or family where smoking is not an option. What are some ways I can cope with stress? Wanting to smoke may cause stress, and stress can make you want to smoke. Find ways to manage your stress. Relaxation techniques can help. For example:  Breathe slowly and deeply, in through your nose and out through your mouth.  Listen to soothing, relaxing music.  Talk with a family member or friend about your stress.  Light a candle.  Soak in a bath or take a shower.  Think about a peaceful place. What are some ways I can prevent weight gain? Be aware that many people gain weight after they quit smoking. However, not everyone does. To keep from gaining weight, have a plan in place before you quit and stick to the plan after you quit. Your plan should include:  Having healthy snacks. When you have a craving, it may help to: ? Eat plain popcorn, crunchy carrots, celery, or other cut vegetables. ? Chew sugar-free gum.  Changing how you eat: ? Eat small portion sizes at meals. ? Eat 4-6 small meals throughout the day instead of 1-2 large meals a day. ? Be mindful when you eat. Do not watch television or do other things that might distract you as you eat.  Exercising regularly: ? Make time to exercise each day. If you do not have time for a long workout, do short bouts of exercise for 5-10 minutes several times a day. ? Do some form of strengthening exercise, like weight lifting, and some form of aerobic exercise, like running or swimming.  Drinking plenty of water or other  low-calorie or no-calorie drinks. Drink 6-8 glasses of water daily, or as much as instructed by your health care provider. Summary  Quitting smoking is a physical and mental challenge. You will face cravings, withdrawal symptoms, and temptation to smoke again. Preparation can help you as you go through these challenges.  You can cope with cravings by keeping your mouth busy (such as by chewing gum), keeping your body and hands busy, and making calls to family, friends, or a  helpline for people who want to quit smoking.  You can cope with withdrawal symptoms by avoiding places where people smoke, avoiding drinks with caffeine, and getting plenty of rest.  Ask your health care provider about the different ways to prevent weight gain, avoid stress, and handle social situations. This information is not intended to replace advice given to you by your health care provider. Make sure you discuss any questions you have with your health care provider. Document Released: 05/04/2016 Document Revised: 04/19/2017 Document Reviewed: 05/04/2016 Elsevier Patient Education  2020 [employer]

## 2018-11-25 LAB — HEPATITIS C ANTIBODY: Hep C Virus Ab: <0.1 s/co ratio (ref 0.0–0.9)

## 2018-11-25 LAB — RPR: RPR Ser Ql: NONREACTIVE

## 2018-11-25 LAB — HIV ANTIBODY (ROUTINE TESTING W REFLEX): HIV Screen 4th Generation wRfx: NONREACTIVE

## 2018-11-26 LAB — CERVICOVAGINAL ANCILLARY ONLY: Trichomonas: NEGATIVE

## 2018-11-26 LAB — URINE CYTOLOGY ANCILLARY ONLY
Chlamydia: NEGATIVE
Neisseria Gonorrhea: NEGATIVE
Trichomonas: NEGATIVE

## 2018-12-11 ENCOUNTER — Inpatient Hospital Stay: Payer: 59

## 2018-12-11 ENCOUNTER — Encounter: Payer: 59 | Admitting: Genetic Counselor

## 2018-12-16 ENCOUNTER — Other Ambulatory Visit: Payer: Self-pay | Admitting: Family Medicine

## 2019-01-15 ENCOUNTER — Other Ambulatory Visit: Payer: Self-pay | Admitting: Family Medicine

## 2019-01-15 NOTE — Telephone Encounter (Signed)
Requested medication (s) are due for refill today: yes  Requested medication (s) are on the active medication list: yes  Last refill: 12/21/2018  Future visit scheduled: yes  Notes to clinic: REQUEST FOR 90 DAYS PRESCRIPTION   Requested Prescriptions  Pending Prescriptions Disp Refills   buPROPion (WELLBUTRIN SR) 150 MG 12 hr tablet [Pharmacy Med Name: BUPROPION HCL SR 150 MG TABLET] 180 tablet 1    Sig: TAKE 1 TABLET BY MOUTH TWICE A DAY     Psychiatry: Antidepressants - bupropion Failed - 01/15/2019  2:31 PM      Failed - Completed PHQ-2 or PHQ-9 in the last 360 days.      Passed - Last BP in normal range    BP Readings from Last 1 Encounters:  11/24/18 111/74         Passed - Valid encounter within last 6 months    Recent Outpatient Visits          1 month ago Routine screening for STI (sexually transmitted infection)   [facility], Lilia Argue, MD   5 months ago Female hirsutism   [facility], Lilia Argue, MD   10 months ago Encounter for annual routine gynecological examination   [facility], Lilia Argue, MD   1 year ago Need for HPV vaccination   Primary [facility] at [facility], [place], [place]   1 year ago Need for hepatitis B booster vaccination   Primary [facility] at [facility], [place], [place]      Future Appointments            In 2 months Pamella Pert, Lilia Argue, MD [facility] at [place], [facility]

## 2019-03-16 ENCOUNTER — Ambulatory Visit (INDEPENDENT_AMBULATORY_CARE_PROVIDER_SITE_OTHER): Payer: 59 | Admitting: Family Medicine

## 2019-03-16 ENCOUNTER — Other Ambulatory Visit: Payer: Self-pay

## 2019-03-16 ENCOUNTER — Encounter: Payer: Self-pay | Admitting: Family Medicine

## 2019-03-16 ENCOUNTER — Other Ambulatory Visit (HOSPITAL_COMMUNITY)
Admission: RE | Admit: 2019-03-16 | Discharge: 2019-03-16 | Disposition: A | Discharge status: Home / Self Care | Payer: 59 | Source: Ambulatory Visit | Attending: Family Medicine | Admitting: Family Medicine

## 2019-03-16 VITALS — BP 128/60 | HR 88 | Temp 98.4°F | Ht 64.0 in | Wt 143.4 lb

## 2019-03-16 DIAGNOSIS — Z01411 Encounter for gynecological examination (general) (routine) with abnormal findings: Secondary | ICD-10-CM

## 2019-03-16 DIAGNOSIS — R350 Frequency of micturition: Secondary | ICD-10-CM | POA: Diagnosis not present

## 2019-03-16 DIAGNOSIS — R7989 Other specified abnormal findings of blood chemistry: Secondary | ICD-10-CM

## 2019-03-16 DIAGNOSIS — Z7251 High risk heterosexual behavior: Secondary | ICD-10-CM

## 2019-03-16 DIAGNOSIS — Z01419 Encounter for gynecological examination (general) (routine) without abnormal findings: Secondary | ICD-10-CM | POA: Diagnosis not present

## 2019-03-16 DIAGNOSIS — Z8742 Personal history of other diseases of the female genital tract: Secondary | ICD-10-CM

## 2019-03-16 DIAGNOSIS — Z113 Encounter for screening for infections with a predominantly sexual mode of transmission: Secondary | ICD-10-CM

## 2019-03-16 DIAGNOSIS — Z800 Family history of malignant neoplasm of digestive organs: Secondary | ICD-10-CM

## 2019-03-16 DIAGNOSIS — Z803 Family history of malignant neoplasm of breast: Secondary | ICD-10-CM

## 2019-03-16 LAB — POCT URINALYSIS DIP (MANUAL ENTRY)
Bilirubin, UA: NEGATIVE
Glucose, UA: NEGATIVE mg/dL
Ketones, POC UA: NEGATIVE mg/dL
Nitrite, UA: NEGATIVE
Protein Ur, POC: NEGATIVE mg/dL
Spec Grav, UA: 1.025 (ref 1.010–1.025)
Urobilinogen, UA: 0.2 E.U./dL
pH, UA: 5.5 (ref 5.0–8.0)

## 2019-03-16 LAB — POCT URINE PREGNANCY: Preg Test, Ur: NEGATIVE

## 2019-03-16 MED ORDER — LEVONORGESTREL 1.5 MG PO TABS: 1.5000 mg | ORAL_TABLET | Freq: Once | ORAL | 2 refills | Status: AC

## 2019-03-16 NOTE — Progress Notes (Signed)
10/26/20208:56 AM  Melanie Carr 09-03-1990, 28 y.o., female [id number]  Chief Complaint  Patient presents with  . Annual Exam    CPE  . Urinary Frequency    HPI:   Patient is a 28 y.o. female  who presents today for CPE  Last CPE Oct 2019 Cervical Cancer Screening: LGSIL 11/2016, neg cotesting 2019 HIV Screening: July 2020 STI Screening: July 2020 Seasonal Influenza Vaccination: declines Td/Tdap Vaccination: 2018 HPV: completed  Frequency of Dental evaluation: Q6 months Frequency of Eye evaluation: needs to schedule G0 Uses condoms, does not want to use BC, condom broke about 2-3 weeks ago, took plan B, has been spotting since then, LMP 03/10/2019, requesting pregnancy and STD test Having urinary urgency, no dysuria or vaginal discharge Has tried OCP and depo and bleed for months at a time  breast cancer: mother at age 98, maternal aunt  ~ late 40s/early 52s, paternal aunt at age 38, unknown BRCA status No ovarian cancer Colon cancer: father at age 26  Exercises regularly Clean diet Has slowed down smoking, 2-3 cig a day. Prior 1ppd Has tried chantix and wellbutrin - had side effects with both Will be trying nicotine gum  History of abnormal TSH  Depression screen [facility] 2/9 11/24/2018 09/08/2018 07/28/2018  Decreased Interest 0 0 0  Down, Depressed, Hopeless 0 0 0  PHQ - 2 Score 0 0 0  Altered sleeping - - -  Tired, decreased energy - - -  Change in appetite - - -  Feeling bad or failure about yourself  - - -  Trouble concentrating - - -  Moving slowly or fidgety/restless - - -  Suicidal thoughts - - -  PHQ-9 Score - - -  Difficult doing work/chores - - -    Fall Risk  03/16/2019 11/24/2018 11/24/2018 09/08/2018 07/28/2018  Falls in the past year? 0 0 0 0 0  Number falls in past yr: 0 0 0 0 0  Injury with Fall? 0 0 0 0 0  Follow up Falls evaluation completed - - - -     No Known Allergies  Prior to Admission medications   Medication Sig Start Date End Date  Taking? Authorizing Provider  buPROPion (WELLBUTRIN SR) 150 MG 12 hr tablet Take 1 tablet (150 mg total) by mouth 2 (two) times daily. Patient not taking: Reported on 03/16/2019 11/24/18   Rutherford Guys, MD    Past Medical History:  Diagnosis Date  . Abnormal cervical cytology   . Bacterial vaginal infection   . Bronchitis   . Chlamydia   . Syphilis   . Trichomonosis     No past surgical history on file.  Social History   Tobacco Use  . Smoking status: Current Every Day Smoker    Packs/day: 0.25    Years: 0.50    Pack years: 0.12    Types: Cigarettes  . Smokeless tobacco: Never Used  . Tobacco comment: Would like to have help quitting smoking. Smoked 1 pack a day for 2.5 years previously  Substance Use Topics  . Alcohol use: Yes    Alcohol/week: 0.0 - 1.0 standard drinks    Family History  Problem Relation Age of Onset  . Cancer Father 9       Colon Cancer  . Hypertension Brother   . Hypertension Maternal Grandmother   . Diabetes Maternal Grandfather   . Hypertension Paternal Grandmother   . Hypertension Paternal Grandfather     Review of Systems  Constitutional:  Negative for chills and fever.  Respiratory: Negative for cough and shortness of breath.   Cardiovascular: Negative for chest pain, palpitations and leg swelling.  Gastrointestinal: Negative for abdominal pain, nausea and vomiting.  All other systems reviewed and are negative.    OBJECTIVE:  Today's Vitals   03/16/19 0847  BP: (!) 143/86  Pulse: 88  Temp: 98.4 F (36.9 C)  TempSrc: Oral  SpO2: 100%  Weight: 143 lb 6.4 oz (65 kg)  Height: _0  (1.626 m)   Body mass index is 24.61 kg/m.   Physical Exam Vitals signs and nursing note reviewed. Exam conducted with a chaperone present.  Constitutional:      Appearance: She is well-developed.  HENT:     Head: Normocephalic and atraumatic.     Right Ear: Hearing, tympanic membrane, ear canal and external ear normal.     Left Ear:  Hearing, tympanic membrane, ear canal and external ear normal.  Eyes:     Conjunctiva/sclera: Conjunctivae normal.     Pupils: Pupils are equal, round, and reactive to light.  Neck:     Musculoskeletal: Neck supple.     Thyroid: No thyromegaly.  Cardiovascular:     Rate and Rhythm: Normal rate and regular rhythm.     Pulses: Normal pulses.     Heart sounds: Normal heart sounds. No murmur. No friction rub. No gallop.   Pulmonary:     Effort: Pulmonary effort is normal.     Breath sounds: Normal breath sounds. No wheezing, rhonchi or rales.  Chest:     Breasts: Breasts are symmetrical.        Right: No inverted nipple, mass, nipple discharge, skin change or tenderness.        Left: No inverted nipple, mass, nipple discharge, skin change or tenderness.  Abdominal:     General: Bowel sounds are normal. There is no distension.     Palpations: Abdomen is soft. There is no hepatomegaly, splenomegaly or mass.     Tenderness: There is no abdominal tenderness. There is no guarding.     Hernia: No hernia is present.  Genitourinary:    Labia:        Right: No rash or lesion.        Left: No rash or lesion.      Vagina: No vaginal discharge or erythema.     Cervix: No cervical motion tenderness, discharge or friability.     Uterus: Not enlarged, not fixed and not tender.      Adnexa:        Right: No mass or tenderness.         Left: No mass or tenderness.    Musculoskeletal: Normal range of motion.     Right lower leg: No edema.     Left lower leg: No edema.  Lymphadenopathy:     Cervical: No cervical adenopathy.     Upper Body:     Right upper body: No supraclavicular, axillary or pectoral adenopathy.     Left upper body: No supraclavicular, axillary or pectoral adenopathy.  Skin:    General: Skin is warm and dry.  Neurological:     General: No focal deficit present.     Mental Status: She is alert and oriented to person, place, and time.     Cranial Nerves: No cranial nerve  deficit.     Gait: Gait normal.     Deep Tendon Reflexes: Reflexes are normal and symmetric.  Psychiatric:  Mood and Affect: Mood normal.        Behavior: Behavior normal.     Results for orders placed or performed in visit on 03/16/19 (from the past 24 hour(s))  POCT urine pregnancy     Status: None   Collection Time: 03/16/19  9:13 AM  Result Value Ref Range   Preg Test, Ur Negative Negative  POCT urinalysis dipstick     Status: Abnormal   Collection Time: 03/16/19  9:13 AM  Result Value Ref Range   Color, UA yellow yellow   Clarity, UA clear clear   Glucose, UA negative negative mg/dL   Bilirubin, UA negative negative   Ketones, POC UA negative negative mg/dL   Spec Grav, UA 1.025 1.010 - 1.025   Blood, UA large (A) negative   pH, UA 5.5 5.0 - 8.0   Protein Ur, POC negative negative mg/dL   Urobilinogen, UA 0.2 0.2 or 1.0 E.U./dL   Nitrite, UA Negative Negative   Leukocytes, UA Trace (A) Negative   On her menses  No results found.   ASSESSMENT and PLAN  1. Encounter for annual routine gynecological examination 4. H/O abnormal cervical Papanicolaou smear No concerns per history or exam. Routine HCM labs ordered. HCM reviewed/discussed. Anticipatory guidance regarding healthy weight, lifestyle and choices given.  - Cytology - PAP, cotesting ordered. If normal, can resume q3 years cervical cancer screening  2. Unprotected sex Discussed safe sex and plan B.  - urine pregnancy test - negative  2. Screening for STD (sexually transmitted disease) Routine screening, patient denies sx. Discussed safe sex - Cytology - PAP (GC/chlamydia/trich) - RPR - HIV Antibody (routine testing w rflx)  3. Urinary frequency - POCT urinalysis dipstick - neg for infection  5. Abnormal thyroid blood test - TSH  6. Family history of breast cancer Discussed breast cancer screening, patient declines genetic counseling at this time.  7. Family history of colon cancer in father  Discussed early colon cancer screening. Starting at age 62.   Other orders - levonorgestrel (PLAN B 1-STEP) 1.5 MG tablet; Take 1 tablet (1.5 mg total) by mouth once for 1 dose.  Return in about 1 year (around 03/15/2020).    Rutherford Guys, MD [facility] at [facility] [place], [place] [address] Ph.  [phone] Fax [phone]

## 2019-03-16 NOTE — Patient Instructions (Addendum)
If you have lab work done today you will be contacted with your lab results within the next 2 weeks.  If you have not heard from [place] then please contact us. The fastest way to get your results is to register for My Chart.   IF you received an x-ray today, you will receive an invoice from [facility]. Please contact [facility] at [phone] with questions or concerns regarding your invoice.   IF you received labwork today, you will receive an invoice from [place]. Please contact [facility] at [phone] with questions or concerns regarding your invoice.   Our billing staff will not be able to assist you with questions regarding bills from these companies.  You will be contacted with the lab results as soon as they are available. The fastest way to get your results is to activate your My Chart account. Instructions are located on the last page of this paperwork. If you have not heard from [place] regarding the results in 2 weeks, please contact this office.      Preventive Care 42-47 Years Old, Female Preventive care refers to visits with your health care provider and lifestyle choices that can promote health and wellness. This includes:  A yearly physical exam. This may also be called an annual well check.  Regular dental visits and eye exams.  Immunizations.  Screening for certain conditions.  Healthy lifestyle choices, such as eating a healthy diet, getting regular exercise, not using drugs or products that contain nicotine and tobacco, and limiting alcohol use. What can I expect for my preventive care visit? Physical exam Your health care provider will check your:  Height and weight. This may be used to calculate body mass index (BMI), which tells if you are at a healthy weight.  Heart rate and blood pressure.  Skin for abnormal spots. Counseling Your health care provider may ask you questions about your:  Alcohol, tobacco, and drug use.  Emotional  well-being.  Home and relationship well-being.  Sexual activity.  Eating habits.  Work and work Statistician.  Method of birth control.  Menstrual cycle.  Pregnancy history. What immunizations do I need?  Influenza (flu) vaccine  This is recommended every year. Tetanus, diphtheria, and pertussis (Tdap) vaccine  You may need a Td booster every 10 years. Varicella (chickenpox) vaccine  You may need this if you have not been vaccinated. Human papillomavirus (HPV) vaccine  If recommended by your health care provider, you may need three doses over 6 months. Measles, mumps, and rubella (MMR) vaccine  You may need at least one dose of MMR. You may also need a second dose. Meningococcal conjugate (MenACWY) vaccine  One dose is recommended if you are age 73-21 years and a first-year college student living in a residence hall, or if you have one of several medical conditions. You may also need additional booster doses. Pneumococcal conjugate (PCV13) vaccine  You may need this if you have certain conditions and were not previously vaccinated. Pneumococcal polysaccharide (PPSV23) vaccine  You may need one or two doses if you smoke cigarettes or if you have certain conditions. Hepatitis A vaccine  You may need this if you have certain conditions or if you travel or work in places where you may be exposed to hepatitis A. Hepatitis B vaccine  You may need this if you have certain conditions or if you travel or work in places where you may be exposed to hepatitis B. Haemophilus influenzae type b (Hib) vaccine  You may need this if you have certain conditions. You may receive vaccines as individual doses or as more than one vaccine together in one shot (combination vaccines). Talk with your health care provider about the risks and benefits of combination vaccines. What tests do I need?  Blood tests  Lipid and cholesterol levels. These may be checked every 5 years starting at age  20.  Hepatitis C test.  Hepatitis B test. Screening  Diabetes screening. This is done by checking your blood sugar (glucose) after you have not eaten for a while (fasting).  Sexually transmitted disease (STD) testing.  BRCA-related cancer screening. This may be done if you have a family history of breast, ovarian, tubal, or peritoneal cancers.  Pelvic exam and Pap test. This may be done every 3 years starting at age 21. Starting at age 30, this may be done every 5 years if you have a Pap test in combination with an HPV test. Talk with your health care provider about your test results, treatment options, and if necessary, the need for more tests. Follow these instructions at home: Eating and drinking   Eat a diet that includes fresh fruits and vegetables, whole grains, lean protein, and low-fat dairy.  Take vitamin and mineral supplements as recommended by your health care provider.  Do not drink alcohol if: ? Your health care provider tells you not to drink. ? You are pregnant, may be pregnant, or are planning to become pregnant.  If you drink alcohol: ? Limit how much you have to 0-1 drink a day. ? Be aware of how much alcohol is in your drink. In the [place] one drink equals one 12 oz bottle of beer (355 mL), one 5 oz glass of wine (148 mL), or one 1 oz glass of hard liquor (44 mL). Lifestyle  Take daily care of your teeth and gums.  Stay active. Exercise for at least 30 minutes on 5 or more days each week.  Do not use any products that contain nicotine or tobacco, such as cigarettes, e-cigarettes, and chewing tobacco. If you need help quitting, ask your health care provider.  If you are sexually active, practice safe sex. Use a condom or other form of birth control (contraception) in order to prevent pregnancy and STIs (sexually transmitted infections). If you plan to become pregnant, see your health care provider for a preconception visit. What's next?  Visit your health  care provider once a year for a well check visit.  Ask your health care provider how often you should have your eyes and teeth checked.  Stay up to date on all vaccines. This information is not intended to replace advice given to you by your health care provider. Make sure you discuss any questions you have with your health care provider. Document Released: 07/03/2001 Document Revised: 01/16/2018 Document Reviewed: 01/16/2018 Elsevier Patient Education  2020 [employer]  

## 2019-03-17 LAB — TSH: TSH: 1.25 u[IU]/mL (ref 0.450–4.500)

## 2019-03-17 LAB — HIV ANTIBODY (ROUTINE TESTING W REFLEX): HIV Screen 4th Generation wRfx: NONREACTIVE

## 2019-03-17 LAB — RPR: RPR Ser Ql: NONREACTIVE

## 2019-03-18 LAB — CYTOLOGY - PAP
Comment: NEGATIVE
Diagnosis: NEGATIVE
High risk HPV: NEGATIVE

## 2019-03-19 ENCOUNTER — Encounter: Payer: Self-pay | Admitting: Family Medicine

## 2019-03-20 ENCOUNTER — Encounter: Payer: Self-pay | Admitting: Family Medicine

## 2019-03-20 NOTE — Telephone Encounter (Signed)
Pt requesting call back from clinical staff in regards to last appt. Please advise

## 2019-08-12 DIAGNOSIS — B351 Tinea unguium: Secondary | ICD-10-CM | POA: Insufficient documentation

## 2019-11-16 ENCOUNTER — Ambulatory Visit: Payer: 59 | Admitting: Family Medicine

## 2019-11-16 ENCOUNTER — Other Ambulatory Visit (HOSPITAL_COMMUNITY)
Admission: RE | Admit: 2019-11-16 | Discharge: 2019-11-16 | Disposition: A | Discharge status: Home / Self Care | Payer: 59 | Source: Ambulatory Visit | Attending: Family Medicine | Admitting: Family Medicine

## 2019-11-16 ENCOUNTER — Encounter: Payer: Self-pay | Admitting: Family Medicine

## 2019-11-16 ENCOUNTER — Other Ambulatory Visit: Payer: Self-pay

## 2019-11-16 VITALS — BP 120/80 | HR 75 | Temp 98.6°F | Ht 64.0 in | Wt 147.0 lb

## 2019-11-16 DIAGNOSIS — Z113 Encounter for screening for infections with a predominantly sexual mode of transmission: Secondary | ICD-10-CM | POA: Diagnosis not present

## 2019-11-16 NOTE — Patient Instructions (Signed)
° ° ° °  If you have lab work done today you will be contacted with your lab results within the next 2 weeks.  If you have not heard from us then please contact us. The fastest way to get your results is to register for My Chart. ° ° °IF you received an x-ray today, you will receive an invoice from [facility]. Please contact  Radiology at [phone] with questions or concerns regarding your invoice.  ° °IF you received labwork today, you will receive an invoice from [facility]. Please contact [facility] at [phone] with questions or concerns regarding your invoice.  ° °Our billing staff will not be able to assist you with questions regarding bills from these companies. ° °You will be contacted with the lab results as soon as they are available. The fastest way to get your results is to activate your My Chart account. Instructions are located on the last page of this paperwork. If you have not heard from us regarding the results in 2 weeks, please contact this office. °  ° ° ° °

## 2019-11-16 NOTE — Progress Notes (Signed)
6/28/202110:20 AM  Linnell Fulling 08/30/1990, 29 y.o., female [id number]  Chief Complaint  Patient presents with  . Follow-up    screening for std, no exposure    HPI:   Patient is a 29 y.o. female who presents today requesting STD testing  Last STD testing a year ago H/o chlamydia - remote Uses condoms for [facility], consistently She denies any symptoms or known exposures Pap due 2023  Started on terbenefine by derm  Depression screen [facility] 2/9 11/24/2018 09/08/2018 07/28/2018  Decreased Interest 0 0 0  Down, Depressed, Hopeless 0 0 0  PHQ - 2 Score 0 0 0  Altered sleeping - - -  Tired, decreased energy - - -  Change in appetite - - -  Feeling bad or failure about yourself  - - -  Trouble concentrating - - -  Moving slowly or fidgety/restless - - -  Suicidal thoughts - - -  PHQ-9 Score - - -  Difficult doing work/chores - - -    Fall Risk  11/16/2019 03/16/2019 11/24/2018 11/24/2018 09/08/2018  Falls in the past year? 0 0 0 0 0  Number falls in past yr: 0 0 0 0 0  Injury with Fall? 0 0 0 0 0  Follow up - Falls evaluation completed - - -     No Known Allergies  Prior to Admission medications   Medication Sig Start Date End Date Taking? Authorizing Provider  terbinafine (LAMISIL) 250 MG tablet Take one tablet by mouth daily x 3 months. 08/18/19  Yes [provider]    Past Medical History:  Diagnosis Date  . Abnormal cervical cytology   . Bacterial vaginal infection   . Bronchitis   . Chlamydia   . Syphilis   . Trichomonosis     No past surgical history on file.  Social History   Tobacco Use  . Smoking status: Current Every Day Smoker    Packs/day: 0.25    Years: 0.50    Pack years: 0.12    Types: Cigarettes  . Smokeless tobacco: Never Used  . Tobacco comment: Would like to have help quitting smoking. Smoked 1 pack a day for 2.5 years previously  Substance Use Topics  . Alcohol use: Yes    Alcohol/week: 0.0 - 1.0 standard drinks    Family History    Problem Relation Age of Onset  . Cancer Father 69       Colon Cancer  . Hypertension Brother   . Hypertension Maternal Grandmother   . Diabetes Maternal Grandfather   . Hypertension Paternal Grandmother   . Hypertension Paternal Grandfather   . Cancer Mother     Review of Systems  Genitourinary: Negative for dysuria and hematuria.       Neg vaginal discharge, pelvic pain, dyspareunia, abnormal vaginal bleeding   Per hpi  OBJECTIVE:  Today's Vitals   11/16/19 1004  BP: 120/80  Pulse: 75  Temp: 98.6 F (37 C)  SpO2: 100%  Weight: 147 lb (66.7 kg)  Height: 5\' 4"  (1.626 m)   Body mass index is 25.23 kg/m.   Wt Readings from Last 3 Encounters:  11/16/19 147 lb (66.7 kg)  03/16/19 143 lb 6.4 oz (65 kg)  11/24/18 143 lb 3.2 oz (65 kg)     Physical Exam Vitals and nursing note reviewed.  Constitutional:      Appearance: She is well-developed.  HENT:     Head: Normocephalic and atraumatic.     Mouth/Throat:  Pharynx: No oropharyngeal exudate.  Eyes:     General: No scleral icterus.    Conjunctiva/sclera: Conjunctivae normal.     Pupils: Pupils are equal, round, and reactive to light.  Cardiovascular:     Rate and Rhythm: Normal rate and regular rhythm.     Heart sounds: Normal heart sounds. No murmur heard.  No friction rub. No gallop.   Pulmonary:     Effort: Pulmonary effort is normal.     Breath sounds: Normal breath sounds. No wheezing or rales.  Musculoskeletal:     Cervical back: Neck supple.  Skin:    General: Skin is warm and dry.  Neurological:     Mental Status: She is alert and oriented to person, place, and time.     No results found for this or any previous visit (from the past 24 hour(s)).  No results found.   ASSESSMENT and PLAN  1. Screening for STD (sexually transmitted disease) Cont with consistent condom use. Treatment as needed pending results. - Urine cytology ancillary only - HIV Antibody (routine testing w rflx) -  RPR  Other orders   Return if symptoms worsen or fail to improve.    Rutherford Guys, MD [facility] [place], [place] [address] Ph.  [phone] Fax [phone]

## 2019-11-17 LAB — URINE CYTOLOGY ANCILLARY ONLY
Chlamydia: NEGATIVE
Comment: NEGATIVE
Comment: NEGATIVE
Comment: NORMAL
Neisseria Gonorrhea: NEGATIVE
Trichomonas: NEGATIVE

## 2019-11-17 LAB — RPR: RPR Ser Ql: NONREACTIVE

## 2019-11-17 LAB — HIV ANTIBODY (ROUTINE TESTING W REFLEX): HIV Screen 4th Generation wRfx: NONREACTIVE

## 2020-01-14 IMAGING — US US THYROID
1 series · 13 of 25 positions shown · non-contrast
Comparison: None.

CLINICAL DATA: Goiter.

EXAM:
THYROID ULTRASOUND
TECHNIQUE: Ultrasound examination of the thyroid gland and adjacent soft
tissues was performed.

[Series 1: us thyroid · 0.05mm/px · 13 of 63 slices shown]
[im 1/63]
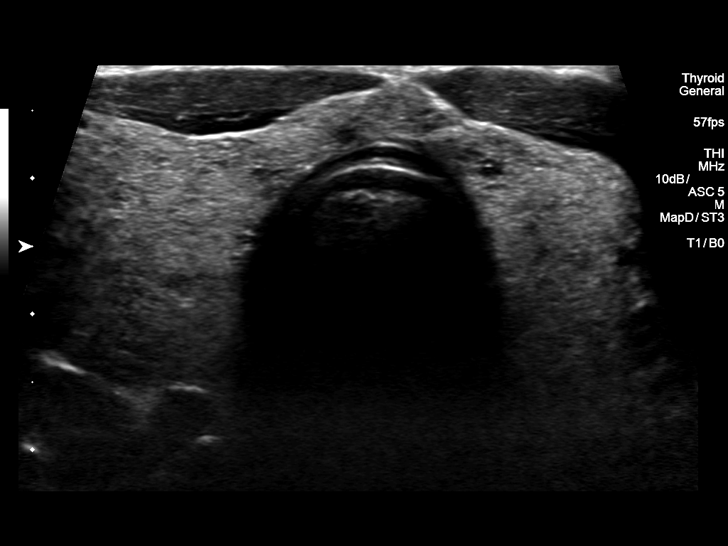
[im 6/63]
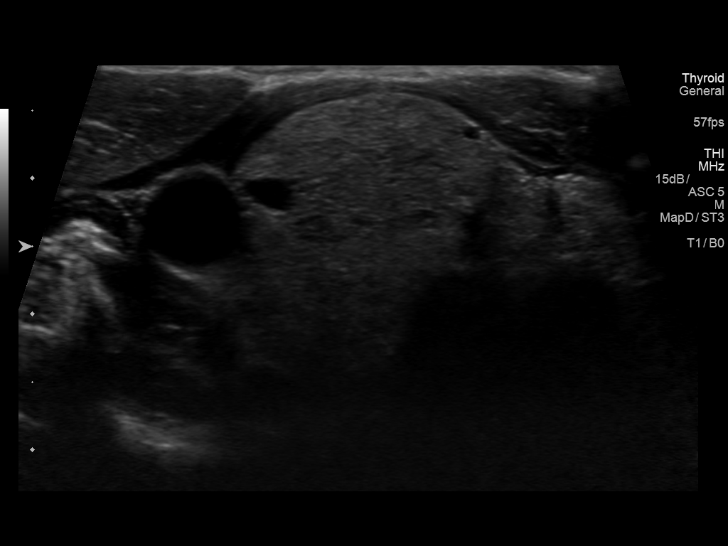
[im 11/63]
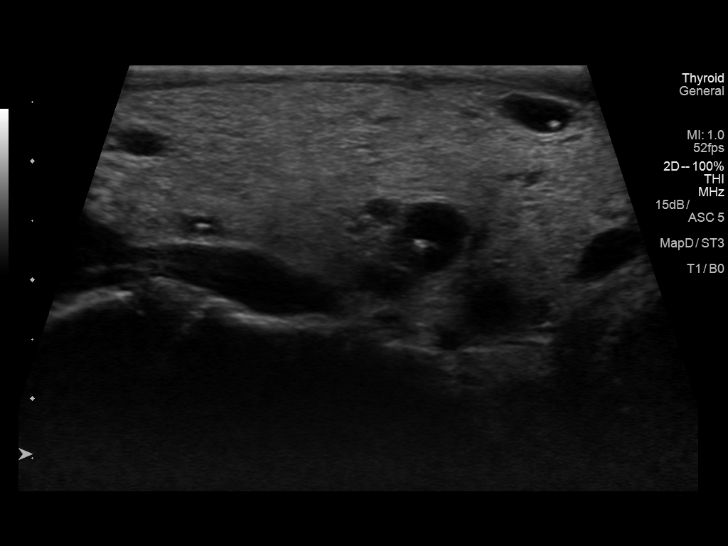
[im 16/63]
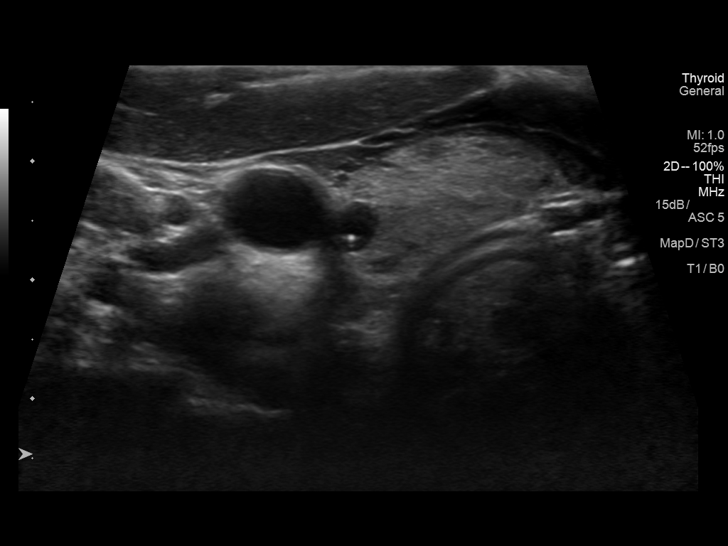
[im 21/63]
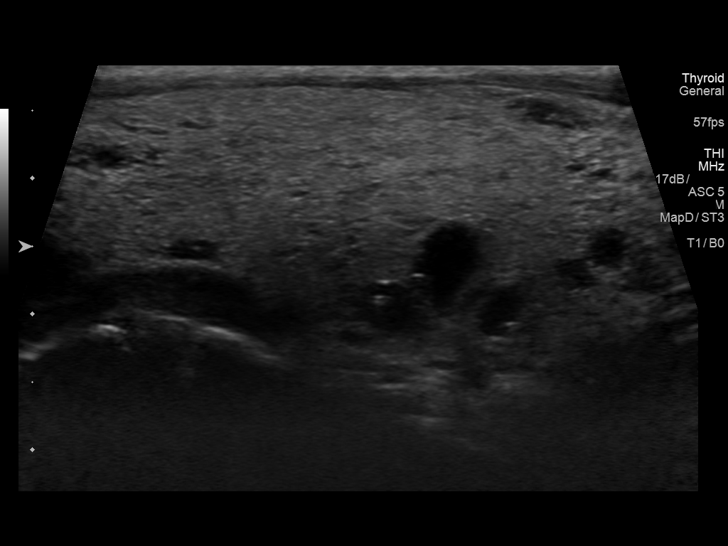
[im 26/63]
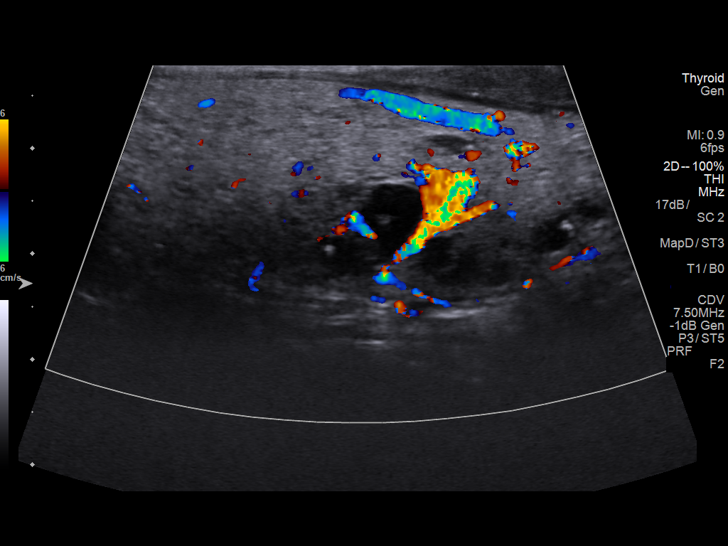
[im 32/63]
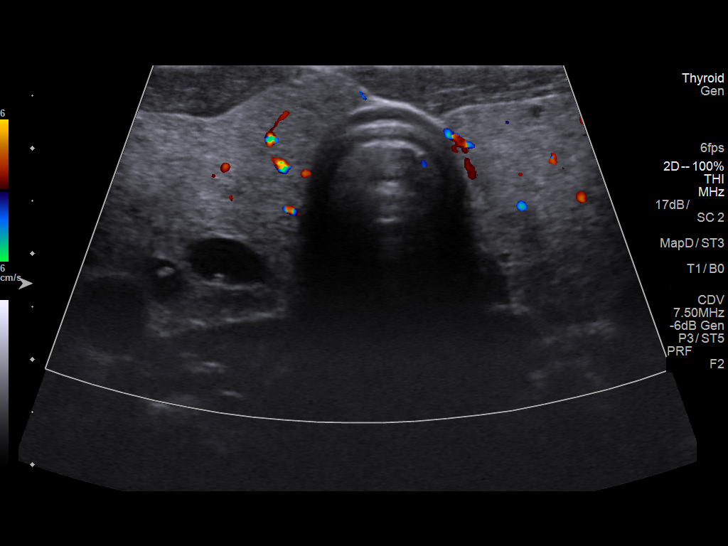
[im 37/63]
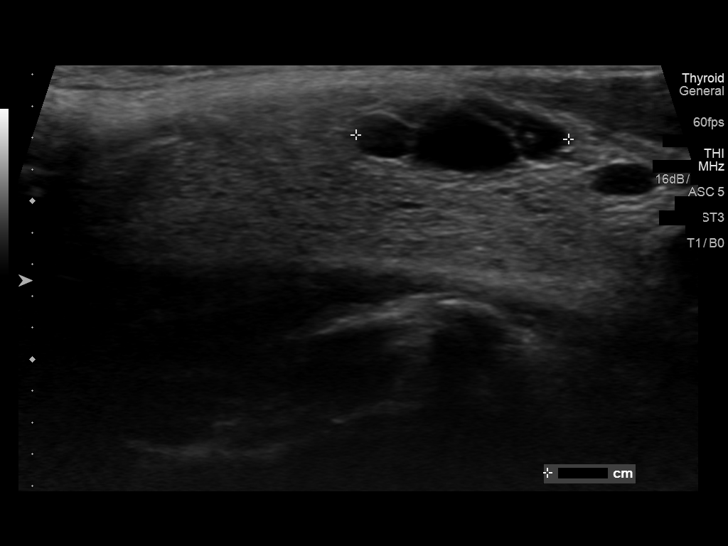
[im 42/63]
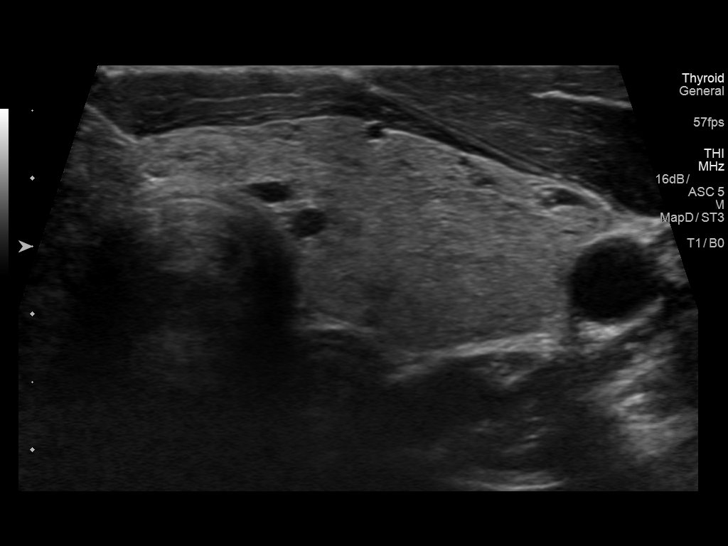
[im 47/63]
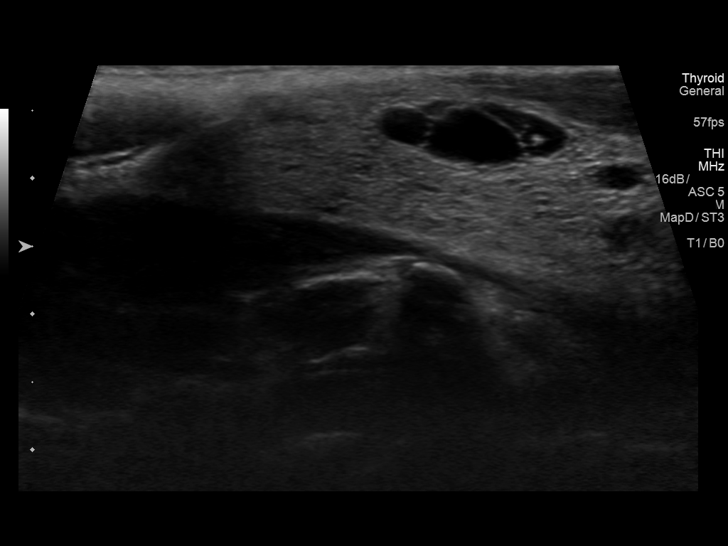
[im 52/63]
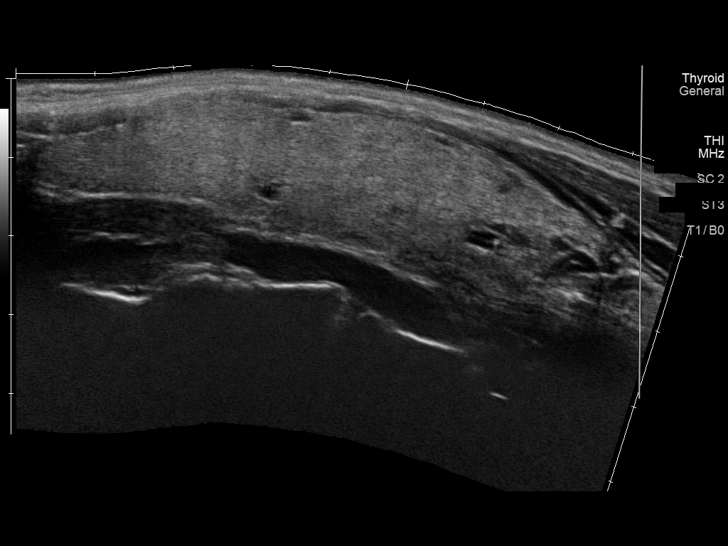
[im 57/63]
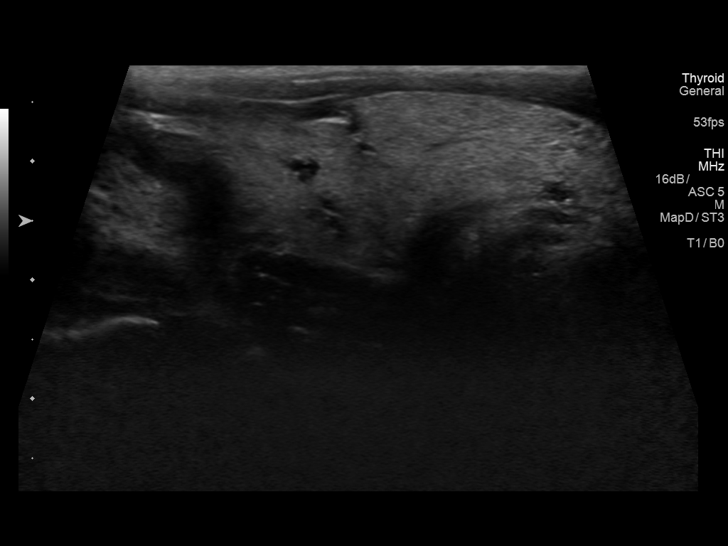
[im 63/63]
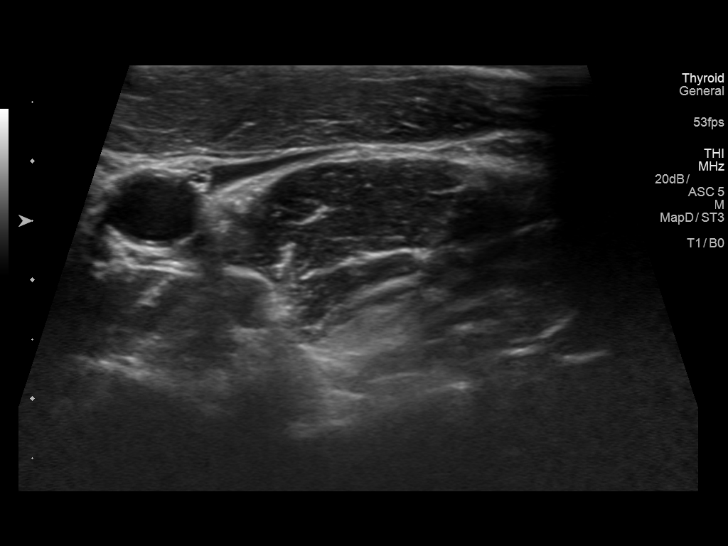

[13 of 25 positions shown; findings below may reference images not displayed]

FINDINGS: Parenchymal Echotexture: Mildly heterogenous

Isthmus: Borderline enlarged measuring 0.8 cm in diameter

Right lobe: Borderline enlarged measuring 7.3 x 2.0 x 2.1 cm

Left lobe: Borderline enlarged measuring 7.3 x 1.7 x 2.5 cm

_________________________________________________________

Estimated total number of nodules >/= 1 cm: 1

Number of spongiform nodules >/=  2 cm not described below (TR1): 0

Number of mixed cystic and solid nodules >/= 1.5 cm not described
below (TR2): 0

_________________________________________________________

There are several scattered punctate anechoic cysts within the right
lobe of the thyroid with dominant cyst measuring approximately 0.8 x
0.7 x 0.6 cm. Many of the cysts contain internal echogenic foci ring
down artifact compatible with benign colloid. None of these benign
colloid containing cysts meet imaging criteria to recommend
percutaneous sampling or continued dedicated follow-up.

There are multiple scattered punctate anechoic cysts scattered
within the left lobe of the thyroid. Dominant anechoic cyst measures
approximately 1.0 x 0.8 x 0.5 cm. Many of these anechoic cysts
including the dominant approximately 1.0 cm cyst, contain internal
echogenic foci with ring down artifact compatible with benign
colloid. None of these benign colloid containing cysts meet imaging
criteria to recommend percutaneous sampling or continued dedicated
follow-up.
IMPRESSION: 1. Borderline thyromegaly with findings suggestive of multinodular
goiter.
2. None of the discretely measured cysts, the majority of which
contain benign colloid, meet imaging criteria to recommend
percutaneous sampling or continued dedicated follow-up.

The above is in keeping with the ACR TI-RADS recommendations - [HOSPITAL] 0029;[DATE].
